# Patient Record
Sex: Female | Born: 1980 | Race: Black or African American | Hispanic: No | Marital: Single | Smoking: Never smoker
Health system: Southern US, Community
[De-identification: ages and names within clinical notes are randomized; demographics above are authoritative.]

## PROBLEM LIST (undated history)

## (undated) DIAGNOSIS — IMO0001 Reserved for inherently not codable concepts without codable children: Secondary | ICD-10-CM

## (undated) DIAGNOSIS — R002 Palpitations: Secondary | ICD-10-CM

## (undated) HISTORY — DX: Reserved for inherently not codable concepts without codable children: IMO0001

## (undated) HISTORY — PX: OTHER SURGICAL HISTORY: SHX169

---

## 2003-06-30 ENCOUNTER — Ambulatory Visit (HOSPITAL_COMMUNITY): Admission: RE | Admit: 2003-06-30 | Discharge: 2003-06-30 | Payer: Self-pay | Admitting: Obstetrics & Gynecology

## 2003-11-19 ENCOUNTER — Inpatient Hospital Stay (HOSPITAL_COMMUNITY): Admission: AD | Admit: 2003-11-19 | Discharge: 2003-11-22 | Payer: Self-pay | Admitting: *Deleted

## 2007-04-05 ENCOUNTER — Emergency Department (HOSPITAL_COMMUNITY): Admission: EM | Admit: 2007-04-05 | Discharge: 2007-04-05 | Payer: Self-pay | Admitting: Emergency Medicine

## 2008-11-04 ENCOUNTER — Emergency Department (HOSPITAL_COMMUNITY): Admission: EM | Admit: 2008-11-04 | Discharge: 2008-11-05 | Payer: Self-pay | Admitting: Emergency Medicine

## 2009-01-26 ENCOUNTER — Emergency Department (HOSPITAL_COMMUNITY): Admission: EM | Admit: 2009-01-26 | Discharge: 2009-01-26 | Payer: Self-pay | Admitting: Emergency Medicine

## 2011-01-17 ENCOUNTER — Inpatient Hospital Stay (INDEPENDENT_AMBULATORY_CARE_PROVIDER_SITE_OTHER)
Admission: RE | Admit: 2011-01-17 | Discharge: 2011-01-17 | Disposition: A | Discharge status: Home / Self Care | Payer: Self-pay | Source: Ambulatory Visit | Attending: Family Medicine | Admitting: Family Medicine

## 2011-01-17 DIAGNOSIS — M79609 Pain in unspecified limb: Secondary | ICD-10-CM

## 2011-02-08 ENCOUNTER — Other Ambulatory Visit: Payer: Self-pay

## 2011-02-08 ENCOUNTER — Emergency Department (HOSPITAL_BASED_OUTPATIENT_CLINIC_OR_DEPARTMENT_OTHER)
Admission: EM | Admit: 2011-02-08 | Discharge: 2011-02-08 | Disposition: A | Discharge status: Home / Self Care | Payer: Self-pay | Attending: Emergency Medicine | Admitting: Emergency Medicine

## 2011-02-08 ENCOUNTER — Encounter: Payer: Self-pay | Admitting: *Deleted

## 2011-02-08 DIAGNOSIS — I499 Cardiac arrhythmia, unspecified: Secondary | ICD-10-CM | POA: Insufficient documentation

## 2011-02-08 DIAGNOSIS — R002 Palpitations: Secondary | ICD-10-CM | POA: Insufficient documentation

## 2011-02-08 LAB — BASIC METABOLIC PANEL
BUN: 11 mg/dL (ref 6–23)
Chloride: 105 mEq/L (ref 96–112)
Glucose, Bld: 86 mg/dL (ref 70–99)
Sodium: 139 mEq/L (ref 135–145)

## 2011-02-08 LAB — DIFFERENTIAL
Basophils Absolute: 0 K/uL (ref 0.0–0.1)
Basophils Relative: 0 % (ref 0–1)
Eosinophils Absolute: 0.2 K/uL (ref 0.0–0.7)
Eosinophils Relative: 2 % (ref 0–5)
Lymphocytes Relative: 35 % (ref 12–46)
Lymphs Abs: 2.9 K/uL (ref 0.7–4.0)
Monocytes Absolute: 0.8 K/uL (ref 0.1–1.0)
Monocytes Relative: 10 % (ref 3–12)
Neutro Abs: 4.4 K/uL (ref 1.7–7.7)
Neutrophils Relative %: 54 % (ref 43–77)

## 2011-02-08 LAB — PREGNANCY, URINE: Preg Test, Ur: NEGATIVE

## 2011-02-08 LAB — URINALYSIS, ROUTINE W REFLEX MICROSCOPIC
Bilirubin Urine: NEGATIVE
Glucose, UA: NEGATIVE mg/dL
Ketones, ur: NEGATIVE mg/dL
Nitrite: NEGATIVE
Protein, ur: 100 mg/dL — AB
Specific Gravity, Urine: 1.006 (ref 1.005–1.030)
Urobilinogen, UA: 1 mg/dL (ref 0.0–1.0)
pH: 7 (ref 5.0–8.0)

## 2011-02-08 LAB — D-DIMER, QUANTITATIVE: D-Dimer, Quant: 0.24 ug{FEU}/mL (ref 0.00–0.48)

## 2011-02-08 LAB — CBC
HCT: 36 % (ref 36.0–46.0)
Hemoglobin: 12.3 g/dL (ref 12.0–15.0)
MCH: 31.2 pg (ref 26.0–34.0)
MCHC: 34.2 g/dL (ref 30.0–36.0)
MCV: 91.4 fL (ref 78.0–100.0)
Platelets: 217 K/uL (ref 150–400)
RBC: 3.94 MIL/uL (ref 3.87–5.11)
RDW: 12.4 % (ref 11.5–15.5)
WBC: 8.2 K/uL (ref 4.0–10.5)

## 2011-02-08 LAB — TROPONIN I: Troponin I: <0.3 ng/mL

## 2011-02-08 NOTE — ED Provider Notes (Signed)
History     CSN: [id number] Arrival date & time: 02/08/2011 12:58 AM   First MD Initiated Contact with Patient 02/08/11 0109      Chief Complaint  Patient presents with  . Irregular Heart Beat    Pt. reports she got hot in bed and then felt her heart race. Pt. reports she felt the need to poop but didn't.      (Consider location/radiation/quality/duration/timing/severity/associated sxs/prior treatment) Patient is a 30 y.o. female presenting with palpitations. The history is provided by the patient.  Palpitations  This is a new problem. The problem occurs constantly. The problem has been resolved. Associated with: Patient is going to sleep and she received an e-mail on her phone that startled her. She developed palpitations that persisted until she got to the emergency department. On average, each episode lasts 60 minutes. Associated symptoms include irregular heartbeat. Pertinent negatives include no diaphoresis, no fever, no chest pain, no near-syncope, no orthopnea, no abdominal pain, no nausea, no vomiting, no headaches, no back pain, no leg pain, no lower extremity edema, no dizziness and no shortness of breath. She has tried nothing for the symptoms. Risk factors include no known risk factors. Her past medical history does not include anemia, heart disease or hyperthyroidism.   Symptoms moderate. There is associated anxiety. No fevers or recent illness. No history of blood clots. Patient has never had her thyroid checked. Currently on menses, on time and normal, denies heavy bleeding. She denies any drug use, alcohol, caffeine, medications or change in diet. She denies any significant stressors. She did develop some numbness in her hands that resolved. Some anxiety now otherwise without any palpitations or other symptoms.  History reviewed. No pertinent past medical history.  History reviewed. No pertinent past surgical history.  No family history on file.  History  Substance Use  Topics  . Smoking status: Not on file  . Smokeless tobacco: Not on file  . Alcohol Use: Not on file    OB History    Grav Para Term Preterm Abortions TAB SAB Ect Mult Living                  Review of Systems  Constitutional: Negative for fever, chills and diaphoresis.  HENT: Negative for neck pain, neck stiffness and tinnitus.   Eyes: Negative for pain.  Respiratory: Negative for shortness of breath.   Cardiovascular: Positive for palpitations. Negative for chest pain, orthopnea and near-syncope.  Gastrointestinal: Negative for nausea, vomiting and abdominal pain.  Genitourinary: Negative for dysuria.  Musculoskeletal: Negative for back pain.  Skin: Negative for rash.  Neurological: Negative for dizziness and headaches.  All other systems reviewed and are negative.    Allergies  Review of patient's allergies indicates not on file.  Home Medications  No current outpatient prescriptions on file.  BP 119/71  Pulse 76  Temp(Src) 97.8 F (36.6 C) (Oral)  Resp 16  Ht 5\' 6"  (1.676 m)  Wt 175 lb (79.379 kg)  BMI 28.25 kg/m2  SpO2 100%  LMP 02/08/2011  Physical Exam  Constitutional: She is oriented to person, place, and time. She appears well-developed and well-nourished.  HENT:  Head: Normocephalic and atraumatic.  Eyes: Conjunctivae and EOM are normal. Pupils are equal, round, and reactive to light.  Neck: Trachea normal. Neck supple. No thyromegaly present.  Cardiovascular: Normal rate, regular rhythm, S1 normal, S2 normal and normal pulses.     No systolic murmur is present   No diastolic murmur is present  Pulses:      Radial pulses are 2+ on the right side, and 2+ on the left side.  Pulmonary/Chest: Effort normal and breath sounds normal. She has no wheezes. She has no rhonchi. She has no rales. She exhibits no tenderness.  Abdominal: Soft. Normal appearance and bowel sounds are normal. There is no tenderness. There is no CVA tenderness and negative Murphy's  sign.  Musculoskeletal:       BLE:s Calves nontender, no cords or erythema, negative Homans sign  Neurological: She is alert and oriented to person, place, and time. She has normal strength. No cranial nerve deficit or sensory deficit. GCS eye subscore is 4. GCS verbal subscore is 5. GCS motor subscore is 6.  Skin: Skin is warm and dry. No rash noted. She is not diaphoretic.  Psychiatric: Her speech is normal.       Cooperative and appropriate    ED Course  Procedures (including critical care time)  Results for orders placed during the hospital encounter of 02/08/11  CBC      Component Value Range   WBC 8.2  4.0 - 10.5 (K/uL)   RBC 3.94  3.87 - 5.11 (MIL/uL)   Hemoglobin 12.3  12.0 - 15.0 (g/dL)   HCT 16.1  09.6 - 04.5 (%)   MCV 91.4  78.0 - 100.0 (fL)   MCH 31.2  26.0 - 34.0 (pg)   MCHC 34.2  30.0 - 36.0 (g/dL)   RDW 40.9  81.1 - 91.4 (%)   Platelets 217  150 - 400 (K/uL)  DIFFERENTIAL      Component Value Range   Neutrophils Relative 54  43 - 77 (%)   Neutro Abs 4.4  1.7 - 7.7 (K/uL)   Lymphocytes Relative 35  12 - 46 (%)   Lymphs Abs 2.9  0.7 - 4.0 (K/uL)   Monocytes Relative 10  3 - 12 (%)   Monocytes Absolute 0.8  0.1 - 1.0 (K/uL)   Eosinophils Relative 2  0 - 5 (%)   Eosinophils Absolute 0.2  0.0 - 0.7 (K/uL)   Basophils Relative 0  0 - 1 (%)   Basophils Absolute 0.0  0.0 - 0.1 (K/uL)  BASIC METABOLIC PANEL      Component Value Range   Sodium 139  135 - 145 (mEq/L)   Potassium 3.4 (*) 3.5 - 5.1 (mEq/L)   Chloride 105  96 - 112 (mEq/L)   CO2 27  19 - 32 (mEq/L)   Glucose, Bld 86  70 - 99 (mg/dL)   BUN 11  6 - 23 (mg/dL)   Creatinine, Ser 7.82  0.50 - 1.10 (mg/dL)   Calcium 9.5  8.4 - 95.6 (mg/dL)   GFR calc non Af Amer >90  >90 (mL/min)   GFR calc Af Amer >90  >90 (mL/min)  URINALYSIS, ROUTINE W REFLEX MICROSCOPIC      Component Value Range   Color, Urine RED (*) YELLOW    Appearance CLEAR  CLEAR    Specific Gravity, Urine 1.006  1.005 - 1.030    pH 7.0  5.0  - 8.0    Glucose, UA NEGATIVE  NEGATIVE (mg/dL)   Hgb urine dipstick LARGE (*) NEGATIVE    Bilirubin Urine NEGATIVE  NEGATIVE    Ketones, ur NEGATIVE  NEGATIVE (mg/dL)   Protein, ur 213 (*) NEGATIVE (mg/dL)   Urobilinogen, UA 1.0  0.0 - 1.0 (mg/dL)   Nitrite NEGATIVE  NEGATIVE    Leukocytes, UA SMALL (*) NEGATIVE  PREGNANCY, URINE      Component Value Range   Preg Test, Ur NEGATIVE    TROPONIN I      Component Value Range   Troponin I <0.30  <0.30 (ng/mL)  D-DIMER, QUANTITATIVE      Component Value Range   D-Dimer, Quant 0.24  0.00 - 0.48 (ug/mL-FEU)  URINE MICROSCOPIC-ADD ON      Component Value Range   Squamous Epithelial / LPF RARE  RARE    WBC, UA 0-2  <3 (WBC/hpf)   RBC / HPF TOO NUMEROUS TO COUNT  <3 (RBC/hpf)   Bacteria, UA RARE  RARE     Date: 02/08/2011  Rate: 70  Rhythm: sinus arrhythmia  QRS Axis: normal  Intervals: normal  ST/T Wave abnormalities: normal  Conduction Disutrbances:none  Narrative Interpretation:   Old EKG Reviewed: none available  Cardiac monitoring. Labs and CXR as above reviewed.    MDM   Palpitations otherwise healthy adult female with no cardiac risk factors and low pretest probability for PE. D-dimer within normal limits. No recent illness and denies any medications or drug ingestion. Anxiety resolved with no history of same. No significant anemia or electrolyte abnormality.  UA reviewed, current menses. No symptoms in the emergency department and no atrial fibrillation or significant arrhythmia otherwise. Plan primary care referral for thyroid testing and cardiology referral for Holter monitor as an outpatient. Patient stable for discharge and comfortable with fall plan.        Sunnie Nielsen, MD 02/08/11 [phone]

## 2011-02-08 NOTE — ED Notes (Signed)
Pt reports having an episode approx 1hour ago of heart palpitations and "feeling hot all over". Pt states it started after she sat up suddenly from a lying position to answer the phone. The episode lasted approx . Resolved prior to arrival to ER. Denies CP/SOB or other associated symptoms. Pt states "i feel completely fine right now".

## 2011-02-08 NOTE — ED Notes (Signed)
Pt. Reports she saw her Dr. Annie Paras. 1 mth ago due to L arm pain.  Was told by the Dr. All normal vitals and she probably had a pulle muscle.  Pt. Reports she took a 325mg  asprin  On way to hospital.

## 2011-02-08 NOTE — ED Notes (Signed)
Dr. Opitz at bedside. 

## 2011-05-02 ENCOUNTER — Other Ambulatory Visit: Payer: Self-pay

## 2011-05-02 ENCOUNTER — Encounter (HOSPITAL_BASED_OUTPATIENT_CLINIC_OR_DEPARTMENT_OTHER): Payer: Self-pay | Admitting: *Deleted

## 2011-05-02 DIAGNOSIS — R002 Palpitations: Secondary | ICD-10-CM | POA: Insufficient documentation

## 2011-05-02 NOTE — ED Notes (Signed)
C/o waking from sleep and felt like her heart was racing. Denies CP, no SOB. Denies any caffeine intake.

## 2011-05-03 ENCOUNTER — Emergency Department (HOSPITAL_BASED_OUTPATIENT_CLINIC_OR_DEPARTMENT_OTHER)
Admission: EM | Admit: 2011-05-03 | Discharge: 2011-05-03 | Discharge status: Against Medical Advice | Payer: Self-pay | Attending: Emergency Medicine | Admitting: Emergency Medicine

## 2011-05-03 DIAGNOSIS — IMO0001 Reserved for inherently not codable concepts without codable children: Secondary | ICD-10-CM

## 2011-05-03 NOTE — ED Notes (Signed)
Attempted to call from lobby to tx room. Pt not found in lobby

## 2011-05-03 NOTE — ED Notes (Signed)
Pt not found in room. Gown on bed. No staff notified prior to pt leaving.

## 2011-05-19 ENCOUNTER — Encounter: Payer: Self-pay | Admitting: Family Medicine

## 2011-05-19 ENCOUNTER — Ambulatory Visit (INDEPENDENT_AMBULATORY_CARE_PROVIDER_SITE_OTHER): Payer: Self-pay | Admitting: Family Medicine

## 2011-05-19 VITALS — BP 118/72 | HR 80 | Temp 98.5°F | Ht 65.5 in | Wt 176.8 lb

## 2011-05-19 DIAGNOSIS — G479 Sleep disorder, unspecified: Secondary | ICD-10-CM

## 2011-05-19 DIAGNOSIS — G478 Other sleep disorders: Secondary | ICD-10-CM

## 2011-05-19 DIAGNOSIS — R002 Palpitations: Secondary | ICD-10-CM

## 2011-05-19 MED ORDER — ASPIRIN 325 MG PO TABS: 325.0000 mg | ORAL_TABLET | Freq: Every day | ORAL | Status: DC

## 2011-05-19 MED ORDER — ALPRAZOLAM 0.25 MG PO TABS: 0.2500 mg | ORAL_TABLET | Freq: Two times a day (BID) | ORAL | Status: AC | PRN

## 2011-05-19 NOTE — Progress Notes (Signed)
  Subjective:    Veronica Stanton is a 31 y.o. female who presents with palpitations. The symptoms are severe, occur during sleep, and last 15 minutes per episode. They tend to occur while sleeping only. Cardiac risk factors include: none. Aggravating factors: stress/anxiety. Relieving factors: spontaneous. Associated symptoms: palpitations, rapid heart beat and shortness of breath. Patient denies: abdominal pain, calf pain, chest pain, cough, dizziness, fatigue, leg swelling, slow heart beat and syncope.  The following portions of the patient's history were reviewed and updated as appropriate: allergies, current medications, past family history, past medical history, past social history, past surgical history and problem list.  Review of Systems Pertinent items are noted in HPI.   Objective:    BP 118/72  Pulse 80  Temp(Src) 98.5 F (36.9 C) (Oral)  Ht 5' 5.5" (1.664 m)  Wt 176 lb 12.8 oz (80.196 kg)  BMI 28.97 kg/m2  SpO2 97%  LMP 04/25/2011 General appearance: alert, cooperative, appears stated age and no distress Lungs: clear to auscultation bilaterally Heart: S1, S2 normal Extremities: extremities normal, atraumatic, no cyanosis or edema  Cardiographics ECG: from ER reviewed   Assessment:    Palpitations --- only occurs at night  Plan:    refer to pulm for sleep eval ER evaluation Neg for cardiac ? Anxiety-- pt given rx xanax to use prn Follow up in 1 months.

## 2011-05-19 NOTE — Patient Instructions (Addendum)
We will refer you to pulmonary for sleep evaluation since this only occurs at night. You have xanax to take if needed for the panic feelings. Return to office in 1 month or sooner if needed.

## 2011-05-20 LAB — BASIC METABOLIC PANEL
BUN: 9 mg/dL (ref 6–23)
Calcium: 8.7 mg/dL (ref 8.4–10.5)
Creatinine, Ser: 0.8 mg/dL (ref 0.4–1.2)
Glucose, Bld: 87 mg/dL (ref 70–99)
Sodium: 135 mEq/L (ref 135–145)

## 2011-05-20 LAB — CBC WITH DIFFERENTIAL/PLATELET
Basophils Relative: 0.2 % (ref 0.0–3.0)
HCT: 37.5 % (ref 36.0–46.0)
Hemoglobin: 12.6 g/dL (ref 12.0–15.0)
Lymphocytes Relative: 26.3 % (ref 12.0–46.0)
MCHC: 33.6 g/dL (ref 30.0–36.0)
Monocytes Absolute: 0.4 10*3/uL (ref 0.1–1.0)
Neutrophils Relative %: 66.9 % (ref 43.0–77.0)
Platelets: 200 10*3/uL (ref 150.0–400.0)
RBC: 3.97 Mil/uL (ref 3.87–5.11)
RDW: 13.6 % (ref 11.5–14.6)
WBC: 7.2 10*3/uL (ref 4.5–10.5)

## 2011-05-20 LAB — TSH: TSH: 0.68 u[IU]/mL (ref 0.35–5.50)

## 2011-05-31 ENCOUNTER — Ambulatory Visit: Payer: Self-pay | Admitting: Family Medicine

## 2011-06-17 ENCOUNTER — Other Ambulatory Visit: Payer: Self-pay

## 2011-06-17 ENCOUNTER — Emergency Department (HOSPITAL_BASED_OUTPATIENT_CLINIC_OR_DEPARTMENT_OTHER)
Admission: EM | Admit: 2011-06-17 | Discharge: 2011-06-17 | Disposition: A | Discharge status: Home / Self Care | Payer: BC Managed Care – PPO | Attending: Emergency Medicine | Admitting: Emergency Medicine

## 2011-06-17 ENCOUNTER — Emergency Department (INDEPENDENT_AMBULATORY_CARE_PROVIDER_SITE_OTHER): Payer: BC Managed Care – PPO

## 2011-06-17 ENCOUNTER — Encounter (HOSPITAL_BASED_OUTPATIENT_CLINIC_OR_DEPARTMENT_OTHER): Payer: Self-pay | Admitting: *Deleted

## 2011-06-17 DIAGNOSIS — R002 Palpitations: Secondary | ICD-10-CM

## 2011-06-17 DIAGNOSIS — R000 Tachycardia, unspecified: Secondary | ICD-10-CM

## 2011-06-17 LAB — CBC
Hemoglobin: 12.5 g/dL (ref 12.0–15.0)
MCV: 88.3 fL (ref 78.0–100.0)
RBC: 4.02 MIL/uL (ref 3.87–5.11)
RDW: 11.9 % (ref 11.5–15.5)
WBC: 8.5 10*3/uL (ref 4.0–10.5)

## 2011-06-17 LAB — DIFFERENTIAL
Basophils Absolute: 0 10*3/uL (ref 0.0–0.1)
Eosinophils Relative: 3 % (ref 0–5)
Lymphocytes Relative: 34 % (ref 12–46)
Monocytes Relative: 10 % (ref 3–12)
Neutro Abs: 4.5 10*3/uL (ref 1.7–7.7)

## 2011-06-17 LAB — BASIC METABOLIC PANEL
CO2: 24 mEq/L (ref 19–32)
Chloride: 103 mEq/L (ref 96–112)
GFR calc non Af Amer: >90 mL/min (ref >90)
Potassium: 3.6 mEq/L (ref 3.5–5.1)
Sodium: 137 mEq/L (ref 135–145)

## 2011-06-17 LAB — PREGNANCY, URINE: Preg Test, Ur: NEGATIVE

## 2011-06-17 NOTE — Discharge Instructions (Signed)
Palpitations  A palpitation is the feeling that your heartbeat is irregular or is faster than normal. Although this is frightening, it usually is not serious. Palpitations may be caused by excesses of smoking, caffeine, or alcohol. They are also brought on by stress and anxiety. Sometimes, they are caused by heart disease. Unless otherwise noted, your caregiver did not find any signs of serious illness at this time. HOME CARE INSTRUCTIONS  To help prevent palpitations:  Drink decaffeinated coffee, tea, and soda pop. Avoid chocolate.   If you smoke or drink alcohol, quit or cut down as much as possible.   Reduce your stress or anxiety level. Biofeedback, yoga, or meditation will help you relax. Physical activity such as swimming, jogging, or walking also may be helpful.  SEEK MEDICAL CARE IF:   You continue to have a fast heartbeat.   Your palpitations occur more often.  SEEK IMMEDIATE MEDICAL CARE IF: You develop chest pain, shortness of breath, severe headache, dizziness, or fainting. Document Released: 03/11/2000 Document Revised: 03/03/2011 Document Reviewed: 05/11/2007 Exit[employer] Patient Information 2012 [employer]. 

## 2011-06-17 NOTE — ED Notes (Signed)
Pt states that she woke feeling like her heart was racing states that she wants an ECG and VS pt was seen here for same sx in Feb and F/U with PCP all results were -

## 2011-06-17 NOTE — ED Provider Notes (Signed)
History     CSN: [id number]  Arrival date & time 06/17/11  0234   First MD Initiated Contact with Patient 06/17/11 0354      Chief Complaint  Patient presents with  . Tachycardia    (Consider location/radiation/quality/duration/timing/severity/associated sxs/prior treatment) Patient is a 31 y.o. female presenting with palpitations. The history is provided by the patient. No language interpreter was used.  Palpitations  This is a recurrent problem. The current episode started 1 to 2 hours ago. The problem occurs constantly. The problem has been resolved. The problem is associated with stress and anxiety. Pertinent negatives include no diaphoresis, no malaise/fatigue, no numbness, no chest pain, no chest pressure, no claudication, no near-syncope, no orthopnea, no leg pain, no lower extremity edema, no dizziness, no weakness, no cough, no hemoptysis and no shortness of breath.  No OCP, no leg pain or swelling no long car trips or plane trips.  PERC negative.    History reviewed. No pertinent past medical history.  History reviewed. No pertinent past surgical history.  History reviewed. No pertinent family history.  History  Substance Use Topics  . Smoking status: Never Smoker   . Smokeless tobacco: Not on file  . Alcohol Use: No    OB History    Grav Para Term Preterm Abortions TAB SAB Ect Mult Living                  Review of Systems  Constitutional: Negative for malaise/fatigue and diaphoresis.  Eyes: Negative.   Respiratory: Negative for cough, hemoptysis and shortness of breath.   Cardiovascular: Positive for palpitations. Negative for chest pain, orthopnea, claudication, leg swelling and near-syncope.  Gastrointestinal: Negative.   Genitourinary: Negative.   Musculoskeletal: Negative.   Skin: Negative.   Neurological: Negative for dizziness, weakness and numbness.  Hematological: Negative.   Psychiatric/Behavioral: Negative.     Allergies  Review of patient's  allergies indicates no known allergies.  Home Medications   Current Outpatient Rx  Name Route Sig Dispense Refill  . ALPRAZOLAM 0.25 MG PO TABS Oral Take 1 tablet (0.25 mg total) by mouth 2 (two) times daily as needed for sleep. 20 tablet 0  . ASPIRIN 325 MG PO TABS Oral Take 1 tablet (325 mg total) by mouth daily.      BP 114/78  Pulse 80  Temp(Src) 98.1 F (36.7 C) (Oral)  Resp 18  SpO2 100%  LMP 05/27/2011  Physical Exam  Constitutional: She is oriented to person, place, and time. She appears well-developed and well-nourished. No distress.  HENT:  Head: Normocephalic and atraumatic.  Eyes: Conjunctivae are normal.  Neck: Normal range of motion. Neck supple.  Cardiovascular: Normal rate and regular rhythm.   Pulmonary/Chest: Effort normal and breath sounds normal. She has no wheezes. She has no rales.  Abdominal: Soft. Bowel sounds are normal. There is no tenderness.  Musculoskeletal: Normal range of motion.  Neurological: She is alert and oriented to person, place, and time. She has normal reflexes.  Skin: Skin is warm and dry. She is not diaphoretic.  Psychiatric: She has a normal mood and affect.    ED Course  Procedures (including critical care time)  Labs Reviewed  CBC - Abnormal; Notable for the following:    HCT 35.5 (*)    All other components within normal limits  DIFFERENTIAL  BASIC METABOLIC PANEL  TROPONIN I  PREGNANCY, URINE   Dg Chest 2 View  06/17/2011  *RADIOLOGY REPORT*  Clinical Data: Tachycardia.  CHEST - 2  VIEW  Comparison: None.  Findings: The lungs are well-aerated and clear.  There is no evidence of focal opacification, pleural effusion or pneumothorax.  The heart is normal in size; the mediastinal contour is within normal limits.  No acute osseous abnormalities are seen.  IMPRESSION: No acute cardiopulmonary process seen.  Original Report Authenticated By: Tonia Ghent, M.D.     1. Palpitations       MDM   Date: 06/17/2011  Rate:  68  Rhythm: normal sinus rhythm  QRS Axis: normal  Intervals: PR shortened  ST/T Wave abnormalities: normal  Conduction Disutrbances:none  Narrative Interpretation:   Old EKG Reviewed: changes noted   Return for CP, shortness of breath or any concerns.  Follow up with your family doctor for event monitor.  Patient verbalizes understanding and agrees to follow up      Marleta Lapierre Smitty Cords, MD 06/17/11 [phone]

## 2011-06-18 ENCOUNTER — Other Ambulatory Visit (HOSPITAL_BASED_OUTPATIENT_CLINIC_OR_DEPARTMENT_OTHER): Payer: Self-pay

## 2011-06-21 ENCOUNTER — Institutional Professional Consult (permissible substitution): Payer: Self-pay | Admitting: Pulmonary Disease

## 2011-07-28 ENCOUNTER — Emergency Department (HOSPITAL_BASED_OUTPATIENT_CLINIC_OR_DEPARTMENT_OTHER)
Admission: EM | Admit: 2011-07-28 | Discharge: 2011-07-29 | Disposition: A | Discharge status: Home / Self Care | Payer: BC Managed Care – PPO | Attending: Emergency Medicine | Admitting: Emergency Medicine

## 2011-07-28 ENCOUNTER — Encounter (HOSPITAL_BASED_OUTPATIENT_CLINIC_OR_DEPARTMENT_OTHER): Payer: Self-pay | Admitting: Emergency Medicine

## 2011-07-28 DIAGNOSIS — R42 Dizziness and giddiness: Secondary | ICD-10-CM | POA: Insufficient documentation

## 2011-07-28 LAB — URINALYSIS, ROUTINE W REFLEX MICROSCOPIC
Ketones, ur: NEGATIVE mg/dL
Leukocytes, UA: NEGATIVE
Nitrite: NEGATIVE
Protein, ur: NEGATIVE mg/dL
Urobilinogen, UA: 1 mg/dL (ref 0.0–1.0)

## 2011-07-28 NOTE — ED Notes (Addendum)
Pt states "I just want to make sure my heart is ok". Pt has had negative EKG's, pt states she occasionally has racing heart beat. Pt also c/o dry cough.

## 2011-07-28 NOTE — ED Provider Notes (Signed)
History     CSN: [id number]  Arrival date & time 07/28/11  2138   First MD Initiated Contact with Patient 07/28/11 2315      Chief Complaint  Patient presents with  . Dizziness    (Consider location/radiation/quality/duration/timing/severity/associated sxs/prior treatment) HPI This is a 31 year old white female who had an episode of lightheadedness after eating dinner this evening. It lasted about 20 minutes and resolved after taking an aspirin. The symptoms are mild to moderate. She has had similar episodes in the past and was worked up by her primary care physician; she states nothing significant was found. She has had episodes of having her heart racing; these usually occur at night. Her primary care physician and discussed having her wear a Holter monitor but this has not yet been done. She denies chest pain or dyspnea. She states her thyroid function has been tested and found to be normal. She states she has no history of anemia.  History reviewed. No pertinent past medical history.  History reviewed. No pertinent past surgical history.  No family history on file.  History  Substance Use Topics  . Smoking status: Never Smoker   . Smokeless tobacco: Not on file  . Alcohol Use: No    OB History    Grav Para Term Preterm Abortions TAB SAB Ect Mult Living                  Review of Systems  All other systems reviewed and are negative.    Allergies  Review of patient's allergies indicates no known allergies.  Home Medications   Current Outpatient Rx  Name Route Sig Dispense Refill  . ASPIRIN 325 MG PO TABS Oral Take 1 tablet (325 mg total) by mouth daily.      BP 116/77  Pulse 62  Temp(Src) 97.5 F (36.4 C) (Oral)  Resp 16  SpO2 100%  Physical Exam General: Well-developed, well-nourished female in no acute distress; appearance consistent with age of record HENT: normocephalic, atraumatic Eyes: pupils equal round and reactive to light; extraocular muscles  intact Neck: supple Heart: Normal sinus rhythm with sinus arrhythmia Lungs: clear to auscultation bilaterally Abdomen: soft; nondistended; nontender; bowel sounds present Extremities: No deformity; full range of motion; pulses normal Neurologic: Awake, alert and oriented; motor function intact in all extremities and symmetric; no facial droop Skin: Warm and dry Psychiatric: Normal mood and affect    ED Course  Procedures (including critical care time)     MDM   Nursing notes and vitals signs, including pulse oximetry, reviewed.  Summary of this visit's results, reviewed by myself:  Labs:  Results for orders placed during the hospital encounter of 07/28/11  URINALYSIS, ROUTINE W REFLEX MICROSCOPIC      Component Value Range   Color, Urine YELLOW  YELLOW    APPearance CLEAR  CLEAR    Specific Gravity, Urine 1.010  1.005 - 1.030    pH 7.0  5.0 - 8.0    Glucose, UA NEGATIVE  NEGATIVE (mg/dL)   Hgb urine dipstick NEGATIVE  NEGATIVE    Bilirubin Urine NEGATIVE  NEGATIVE    Ketones, ur NEGATIVE  NEGATIVE (mg/dL)   Protein, ur NEGATIVE  NEGATIVE (mg/dL)   Urobilinogen, UA 1.0  0.0 - 1.0 (mg/dL)   Nitrite NEGATIVE  NEGATIVE    Leukocytes, UA NEGATIVE  NEGATIVE   PREGNANCY, URINE      Component Value Range   Preg Test, Ur NEGATIVE  NEGATIVE  Date: 07/28/2011 23:27  Rate: 65  Rhythm: normal sinus rhythm with sinus arrhythmia  QRS Axis: normal  Intervals: normal  ST/T Wave abnormalities: normal  Conduction Disutrbances: none  Narrative Interpretation: unremarkable  Comparison with previous EKG: PR interval has normalized  12:11 AM Patient will follow up with her primary care physician for possible Holter monitoring.           Hanley Seamen, MD 07/29/11 [phone]

## 2011-07-28 NOTE — ED Notes (Signed)
Pt reports light headedness and dizziness after eating. Pt has intermittent sensation of difficulty swallowing when drinking water.

## 2011-07-29 ENCOUNTER — Encounter (HOSPITAL_BASED_OUTPATIENT_CLINIC_OR_DEPARTMENT_OTHER): Payer: Self-pay

## 2011-07-29 NOTE — Discharge Instructions (Signed)

## 2011-10-19 ENCOUNTER — Encounter: Payer: Self-pay | Admitting: Internal Medicine

## 2011-10-19 ENCOUNTER — Ambulatory Visit (INDEPENDENT_AMBULATORY_CARE_PROVIDER_SITE_OTHER): Payer: BC Managed Care – PPO | Admitting: Internal Medicine

## 2011-10-19 VITALS — BP 118/76 | HR 76 | Temp 98.0°F | Wt 173.0 lb

## 2011-10-19 DIAGNOSIS — D649 Anemia, unspecified: Secondary | ICD-10-CM

## 2011-10-19 DIAGNOSIS — I479 Paroxysmal tachycardia, unspecified: Secondary | ICD-10-CM

## 2011-10-19 DIAGNOSIS — R002 Palpitations: Secondary | ICD-10-CM

## 2011-10-19 DIAGNOSIS — R42 Dizziness and giddiness: Secondary | ICD-10-CM

## 2011-10-19 NOTE — Progress Notes (Signed)
  Subjective:    Patient ID: Veronica Stanton, female    DOB: 09/21/1980, 31 y.o.   MRN: [id number]  HPI She has been having intermittent tachycardia since December 2012. Typically this would  last 15 min & occur a at night and awaken her. She states that these episodes have been associated with sensation of seeing" black writing "on the wall which faces the bed.The pattern recurred in January, April, May, and again this month. On 7/21 she noticed the tachycardia  while driving; this was associated with significant dizziness. She had not had dizziness with the prior episodes. She was seen in the emergency room for the 2 episodes in December and January.  EKG 07/28/11 revealed nonspecific T wave changes. Review of labs in the chart reveals hematocrit 35.5 on 06/17/11. Full thyroid function tests were normal on 2/21   Review of Systems These episodes have not been associated with benign positional vertigo,chest  pain, straining or palpitations. She did have a headache after the episode 7/21. She is not had numbness or tingling or weakness in her limbs. She's had no associated seizure activity, syncope, or stool or urine incontinence. She has not had associated blurred vision, double vision, or loss of vision. There's been no tinnitus or hearing loss. She denies dyspnea but has a sensation that she can't get a complete breath. She denies anxiety, depression,or  panic attacks. There has not been a constellation of headache, flushing, chest pain, and diarrhea.        Objective:   Physical Exam  Gen. appearance: Well-nourished, in no distress Eyes: Extraocular motion intact, field of vision normal, vision grossly intact, no nystagmus ENT: Canals clear, tympanic membranes normal, tuning fork exam normal, hearing grossly normal Neck: Normal range of motion, no masses, normal thyroid Cardiovascular: Rate and rhythm normal; no murmurs, gallops or extra heart sounds Muscle skeletal: Range of motion, tone, &   strength normal Neuro:no cranial nerve deficit, deep tendon  reflexes normal, gait normal, Romberg testing and finger to nose testing normal Lymph: No cervical or axillary LA Skin: Warm and dry without suspicious lesions or rashes Psych: no anxiety or mood change. Normally interactive and cooperative. Very articulate and intelligent        Assessment & Plan:  #1 paroxysmal tachycardia x8 months  #2 isolated episode of dizziness  #3 visual dysfunction with # 1  #4 PAC vs PNC on EKG; the nonspecific T changes noted previously have improved. It is necessary to rule out paroxysmal supraventricular tachycardia.  Plan: An event monitor will be necessary. Low-dose coated aspirin recommended. See orders for labs

## 2011-10-19 NOTE — Patient Instructions (Addendum)
To prevent palpitations or premature beats, avoid stimulants such as decongestants, diet pills, nicotine, or caffeine (coffee, tea, cola, or chocolate) to excess.  Please take enteric-coated aspirin 81 mg daily with breakfast. Please try to go on My Chart within the next 24 hours to allow me to release the results directly to you.

## 2011-10-20 LAB — MAGNESIUM: Magnesium: 1.8 mg/dL (ref 1.5–2.5)

## 2011-10-20 LAB — CBC WITH DIFFERENTIAL/PLATELET
Basophils Absolute: 0 10*3/uL (ref 0.0–0.1)
Eosinophils Absolute: 0.1 10*3/uL (ref 0.0–0.7)
Hemoglobin: 12.6 g/dL (ref 12.0–15.0)
Lymphocytes Relative: 30 % (ref 12.0–46.0)
MCHC: 33.4 g/dL (ref 30.0–36.0)
Neutro Abs: 5 10*3/uL (ref 1.4–7.7)
Neutrophils Relative %: 62.2 % (ref 43.0–77.0)
Platelets: 148 10*3/uL — ABNORMAL LOW (ref 150.0–400.0)
RDW: 13.3 % (ref 11.5–14.6)

## 2011-10-20 LAB — TSH: TSH: 0.82 u[IU]/mL (ref 0.35–5.50)

## 2011-10-20 LAB — BASIC METABOLIC PANEL
Chloride: 102 mEq/L (ref 96–112)
Creatinine, Ser: 0.8 mg/dL (ref 0.4–1.2)

## 2011-10-24 ENCOUNTER — Telehealth: Payer: Self-pay | Admitting: Family Medicine

## 2011-10-24 DIAGNOSIS — I479 Paroxysmal tachycardia, unspecified: Secondary | ICD-10-CM

## 2011-10-24 DIAGNOSIS — R002 Palpitations: Secondary | ICD-10-CM

## 2011-10-24 NOTE — Telephone Encounter (Signed)
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 Her history seems straightforward &potentially worrisome. I recommend cardiology consultation. I would also like to refer this chart to [facility] Peer Review concerning my request for the event monitor

## 2011-10-24 NOTE — Telephone Encounter (Signed)
Pt number on chart is incorrect contacted Pt EC and advise him to have Pt give [place] a call.

## 2011-10-24 NOTE — Telephone Encounter (Signed)
Discuss with patient  

## 2011-10-24 NOTE — Telephone Encounter (Signed)
In reference to your order for a Cardiac Event Monitor, per fax from [facility], they have denied.  Their reason states that the request does not meet the definition of Medical Necessity found in the member's benefit booklet.  Fax placed in your box.

## 2011-10-24 NOTE — Telephone Encounter (Signed)
hopp ordered this

## 2011-10-24 NOTE — Telephone Encounter (Signed)
Pt returned your call. I updated her ph# in EPIC. Pt can be reached at [phone]

## 2011-10-25 NOTE — Telephone Encounter (Signed)
Patient scheduled for consult with Dr. Ladona Ridgel at [facility], and I have made patient aware.

## 2011-10-25 NOTE — Addendum Note (Signed)
Addended by: Candie Echevaria L on: 10/25/2011 08:55 AM   Modules accepted: Orders

## 2011-11-16 ENCOUNTER — Encounter: Payer: Self-pay | Admitting: Internal Medicine

## 2011-11-16 ENCOUNTER — Ambulatory Visit (INDEPENDENT_AMBULATORY_CARE_PROVIDER_SITE_OTHER): Payer: BC Managed Care – PPO | Admitting: Internal Medicine

## 2011-11-16 VITALS — BP 102/74 | HR 66 | Ht 67.0 in | Wt 173.6 lb

## 2011-11-16 DIAGNOSIS — R0600 Dyspnea, unspecified: Secondary | ICD-10-CM | POA: Insufficient documentation

## 2011-11-16 DIAGNOSIS — R0989 Other specified symptoms and signs involving the circulatory and respiratory systems: Secondary | ICD-10-CM

## 2011-11-16 DIAGNOSIS — R002 Palpitations: Secondary | ICD-10-CM

## 2011-11-16 NOTE — Patient Instructions (Addendum)
Your physician recommends that you schedule a follow-up appointment in: 7-8 weeks with Dr Ladona Ridgel  Your physician has requested that you have an echocardiogram. Echocardiography is a painless test that uses sound waves to create images of your heart. It provides your doctor with information about the size and shape of your heart and how well your heart's chambers and valves are working. This procedure takes approximately one hour. There are no restrictions for this procedure.   Your physician has recommended that you wear an event monitor. Event monitors are medical devices that record the heart's electrical activity. Doctors most often [place] these monitors to diagnose arrhythmias. Arrhythmias are problems with the speed or rhythm of the heartbeat. The monitor is a small, portable device. You can wear one while you do your normal daily activities. This is usually used to diagnose what is causing palpitations/syncope (passing out).  Palpitations  A palpitation is the feeling that your heartbeat is irregular or is faster than normal. Although this is frightening, it usually is not serious. Palpitations may be caused by excesses of smoking, caffeine, or alcohol. They are also brought on by stress and anxiety. Sometimes, they are caused by heart disease. Unless otherwise noted, your caregiver did not find any signs of serious illness at this time. HOME CARE INSTRUCTIONS  To help prevent palpitations:  Drink decaffeinated coffee, tea, and soda pop. Avoid chocolate.   If you smoke or drink alcohol, quit or cut down as much as possible.   Reduce your stress or anxiety level. Biofeedback, yoga, or meditation will help you relax. Physical activity such as swimming, jogging, or walking also may be helpful.  SEEK MEDICAL CARE IF:   You continue to have a fast heartbeat.   Your palpitations occur more often.  SEEK IMMEDIATE MEDICAL CARE IF: You develop chest pain, shortness of breath, severe headache,  dizziness, or fainting. Document Released: 03/11/2000 Document Revised: 03/03/2011 Document Reviewed: 05/11/2007 [facility] Patient Information 2012 [place], [place].

## 2011-11-16 NOTE — Progress Notes (Signed)
HPI Veronica Stanton is referred today for evaluation of tachycardia palpitations. The patient is a very pleasant 31 year old woman who's health is been quite good. Over the last 7 or 8 months, she has had episodes where her heart will suddenly feel like it is racing. These episodes typically last 15-20 minutes, starting and stopping suddenly. She has not been able to count her heart rate but she notes it is quite fast and regular. She has very mild shortness of breath with these episodes but has had no syncope or chest pressure. On several occasions, she has gone to the emergency room but prior to her arrival, the episode stop suddenly. She denies peripheral edema or other cardiac symptoms. No Known Allergies   Current Outpatient Prescriptions  Medication Sig Dispense Refill  . aspirin 81 MG tablet Take 81 mg by mouth every 7 (seven) days.         Past Medical History  Diagnosis Date  . No diagnosis     ROS:   All systems reviewed and negative except as noted in the HPI.   Past Surgical History  Procedure Date  . Negative      Family History  Problem Relation Age of Onset  . Stroke Neg Hx   . Heart disease Neg Hx   . Mental illness Neg Hx      History   Social History  . Marital Status: Single    Spouse Name: N/A    Number of Children: N/A  . Years of Education: N/A   Occupational History  . Not on file.   Social History Main Topics  . Smoking status: Never Smoker   . Smokeless tobacco: Not on file  . Alcohol Use: No  . Drug Use: No  . Sexually Active:    Other Topics Concern  . Not on file   Social History Narrative  . No narrative on file     BP 102/74  Pulse 66  Ht 5\' 7"  (1.702 m)  Wt 173 lb 9.6 oz (78.744 kg)  BMI 27.19 kg/m2  Physical Exam:  Well appearing 31 year old woman, NAD HEENT: Unremarkable Neck:  No JVD, no thyromegally Lungs:  Clear with no wheezes, rales, or rhonchi. HEART:  Regular rate rhythm, no murmurs, no rubs, no clicks Abd:   soft, positive bowel sounds, no organomegally, no rebound, no guarding Ext:  2 plus pulses, no edema, no cyanosis, no clubbing Skin:  No rashes no nodules Neuro:  CN II through XII intact, motor grossly intact  EKG Normal sinus rhythm with a short PR interval but no ventricular preexcitation.  Assess/Plan:

## 2011-11-16 NOTE — Assessment & Plan Note (Signed)
Her dyspnea is typically do to her tachycardia palpitations I've recommended a 2-D echo to rule out evidence of structural heart disease as the finding of left ventricular dysfunction and palpitations has a very different prognostic meaning compared to palpitations with preserved left ventricular function.

## 2011-11-22 ENCOUNTER — Ambulatory Visit (HOSPITAL_COMMUNITY): Payer: BC Managed Care – PPO | Attending: Cardiology | Admitting: Radiology

## 2011-11-22 ENCOUNTER — Encounter (INDEPENDENT_AMBULATORY_CARE_PROVIDER_SITE_OTHER): Payer: BC Managed Care – PPO

## 2011-11-22 DIAGNOSIS — I059 Rheumatic mitral valve disease, unspecified: Secondary | ICD-10-CM | POA: Insufficient documentation

## 2011-11-22 DIAGNOSIS — I079 Rheumatic tricuspid valve disease, unspecified: Secondary | ICD-10-CM | POA: Insufficient documentation

## 2011-11-22 DIAGNOSIS — R002 Palpitations: Secondary | ICD-10-CM

## 2011-11-22 DIAGNOSIS — R0989 Other specified symptoms and signs involving the circulatory and respiratory systems: Secondary | ICD-10-CM | POA: Insufficient documentation

## 2011-11-22 DIAGNOSIS — R0609 Other forms of dyspnea: Secondary | ICD-10-CM | POA: Insufficient documentation

## 2011-11-22 NOTE — Progress Notes (Signed)
Echocardiogram performed.  

## 2012-01-12 ENCOUNTER — Ambulatory Visit (INDEPENDENT_AMBULATORY_CARE_PROVIDER_SITE_OTHER): Payer: BC Managed Care – PPO | Admitting: Internal Medicine

## 2012-01-12 ENCOUNTER — Encounter: Payer: Self-pay | Admitting: Internal Medicine

## 2012-01-12 VITALS — BP 124/82 | HR 67 | Ht 67.0 in | Wt 173.0 lb

## 2012-01-12 DIAGNOSIS — R0989 Other specified symptoms and signs involving the circulatory and respiratory systems: Secondary | ICD-10-CM

## 2012-01-12 DIAGNOSIS — R002 Palpitations: Secondary | ICD-10-CM

## 2012-01-12 DIAGNOSIS — R0600 Dyspnea, unspecified: Secondary | ICD-10-CM

## 2012-01-12 NOTE — Progress Notes (Signed)
HPI Mrs. Veronica Stanton returns today for followup. She is a very pleasant 31 year old woman who I saw several weeks ago who carries a history of tachycardia palpitations, and increasing dyspnea of unclear etiology. Subsequent evaluation demonstrates normal left ventricular function by echo and no arrhythmias seen by 48 hour Holter monitor. Her symptoms have improved in the interim. She admits to being fairly sedentary walking only one day a week. She has had no sustained arrhythmias. She denies peripheral edema, chest pain, or syncope. No Known Allergies   Current Outpatient Prescriptions  Medication Sig Dispense Refill  . aspirin 81 MG tablet Take 81 mg by mouth every 7 (seven) days.         Past Medical History  Diagnosis Date  . No diagnosis     ROS:   All systems reviewed and negative except as noted in the HPI.   Past Surgical History  Procedure Date  . Negative      Family History  Problem Relation Age of Onset  . Stroke Neg Hx   . Heart disease Neg Hx   . Mental illness Neg Hx      History   Social History  . Marital Status: Single    Spouse Name: N/A    Number of Children: N/A  . Years of Education: N/A   Occupational History  . Not on file.   Social History Main Topics  . Smoking status: Never Smoker   . Smokeless tobacco: Not on file  . Alcohol Use: No  . Drug Use: No  . Sexually Active:    Other Topics Concern  . Not on file   Social History Narrative  . No narrative on file     BP 124/82  Pulse 67  Ht 5\' 7"  (1.702 m)  Wt 173 lb (78.472 kg)  BMI 27.10 kg/m2  SpO2 99%  Physical Exam:  Well appearing 31 year old woman, NAD HEENT: Unremarkable Neck:  No JVD, no thyromegally Lungs:  Clear with no wheezes, rales, or rhonchi. HEART:  Regular rate rhythm, no murmurs, no rubs, no clicks Abd:  soft, positive bowel sounds, no organomegally, no rebound, no guarding Ext:  2 plus pulses, no edema, no cyanosis, no clubbing Skin:  No rashes no  nodules Neuro:  CN II through XII intact, motor grossly intact  2-D echo - normal left ventricular function, no significant valvular abnormalities.  48 hour Holter monitor  - no sustained arrhythmias. Sinus tachycardia. Noise artifact.  Assess/Plan:

## 2012-01-12 NOTE — Patient Instructions (Signed)
Your physician recommends that you schedule a follow-up appointment as needed  

## 2012-01-12 NOTE — Assessment & Plan Note (Signed)
Her dyspnea is improved. I suspect deconditioning. Her left ventricular function is normal. I've encouraged the patient to start exercising daily. If her symptoms of dyspnea worsen, a cardiopulmonary stress test would be a consideration.

## 2012-01-12 NOTE — Assessment & Plan Note (Signed)
Her symptoms have improved. The etiology is at this time unclear as she had no arrhythmia during the wearing of her cardiac monitor. I've recommended watchful waiting.

## 2012-06-06 ENCOUNTER — Encounter (HOSPITAL_BASED_OUTPATIENT_CLINIC_OR_DEPARTMENT_OTHER): Payer: Self-pay | Admitting: *Deleted

## 2012-06-06 ENCOUNTER — Emergency Department (HOSPITAL_BASED_OUTPATIENT_CLINIC_OR_DEPARTMENT_OTHER)
Admission: EM | Admit: 2012-06-06 | Discharge: 2012-06-06 | Disposition: A | Discharge status: Home / Self Care | Payer: BC Managed Care – PPO | Attending: Emergency Medicine | Admitting: Emergency Medicine

## 2012-06-06 DIAGNOSIS — Z7982 Long term (current) use of aspirin: Secondary | ICD-10-CM | POA: Insufficient documentation

## 2012-06-06 DIAGNOSIS — Y9389 Activity, other specified: Secondary | ICD-10-CM | POA: Insufficient documentation

## 2012-06-06 DIAGNOSIS — Y92410 Unspecified street and highway as the place of occurrence of the external cause: Secondary | ICD-10-CM | POA: Insufficient documentation

## 2012-06-06 DIAGNOSIS — S46812A Strain of other muscles, fascia and tendons at shoulder and upper arm level, left arm, initial encounter: Secondary | ICD-10-CM

## 2012-06-06 DIAGNOSIS — S46819A Strain of other muscles, fascia and tendons at shoulder and upper arm level, unspecified arm, initial encounter: Secondary | ICD-10-CM | POA: Insufficient documentation

## 2012-06-06 DIAGNOSIS — Z8679 Personal history of other diseases of the circulatory system: Secondary | ICD-10-CM | POA: Insufficient documentation

## 2012-06-06 DIAGNOSIS — S43499A Other sprain of unspecified shoulder joint, initial encounter: Secondary | ICD-10-CM | POA: Insufficient documentation

## 2012-06-06 HISTORY — DX: Palpitations: R00.2

## 2012-06-06 MED ORDER — TRAMADOL HCL 50 MG PO TABS
50.0000 mg | ORAL_TABLET | Freq: Four times a day (QID) | ORAL | Status: DC | PRN
Start: 2012-06-06 — End: 2014-07-19

## 2012-06-06 MED ORDER — IBUPROFEN 600 MG PO TABS: 600.0000 mg | ORAL_TABLET | Freq: Four times a day (QID) | ORAL | Status: DC | PRN

## 2012-06-06 MED ORDER — METHOCARBAMOL 500 MG PO TABS: 500.0000 mg | ORAL_TABLET | Freq: Two times a day (BID) | ORAL | Status: DC

## 2012-06-06 MED ORDER — IBUPROFEN 200 MG PO TABS
600.0000 mg | ORAL_TABLET | Freq: Once | ORAL | Status: AC
  Administered 2012-06-06: 600 mg via ORAL
  Filled 2012-06-06: qty 1
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
## 2013-01-31 ENCOUNTER — Other Ambulatory Visit: Payer: Self-pay

## 2014-07-19 ENCOUNTER — Emergency Department (HOSPITAL_BASED_OUTPATIENT_CLINIC_OR_DEPARTMENT_OTHER)
Admission: EM | Admit: 2014-07-19 | Discharge: 2014-07-19 | Disposition: A | Discharge status: Home / Self Care | Payer: BLUE CROSS/BLUE SHIELD | Attending: Emergency Medicine | Admitting: Emergency Medicine

## 2014-07-19 ENCOUNTER — Encounter (HOSPITAL_BASED_OUTPATIENT_CLINIC_OR_DEPARTMENT_OTHER): Payer: Self-pay | Admitting: Emergency Medicine

## 2014-07-19 DIAGNOSIS — R55 Syncope and collapse: Secondary | ICD-10-CM | POA: Diagnosis not present

## 2014-07-19 DIAGNOSIS — Z79899 Other long term (current) drug therapy: Secondary | ICD-10-CM | POA: Insufficient documentation

## 2014-07-19 DIAGNOSIS — Z3202 Encounter for pregnancy test, result negative: Secondary | ICD-10-CM | POA: Insufficient documentation

## 2014-07-19 LAB — CBC WITH DIFFERENTIAL/PLATELET
BASOS ABS: 0 10*3/uL (ref 0.0–0.1)
BASOS PCT: 0 % (ref 0–1)
EOS ABS: 0.1 10*3/uL (ref 0.0–0.7)
EOS PCT: 1 % (ref 0–5)
HCT: 34.6 % — ABNORMAL LOW (ref 36.0–46.0)
Hemoglobin: 11.9 g/dL — ABNORMAL LOW (ref 12.0–15.0)
LYMPHS ABS: 1.3 10*3/uL (ref 0.7–4.0)
Lymphocytes Relative: 20 % (ref 12–46)
MCH: 31.3 pg (ref 26.0–34.0)
MCHC: 34.4 g/dL (ref 30.0–36.0)
MCV: 91.1 fL (ref 78.0–100.0)
Monocytes Absolute: 0.7 10*3/uL (ref 0.1–1.0)
Monocytes Relative: 11 % (ref 3–12)
NEUTROS PCT: 68 % (ref 43–77)
Neutro Abs: 4.3 10*3/uL (ref 1.7–7.7)
PLATELETS: 194 10*3/uL (ref 150–400)
RBC: 3.8 MIL/uL — AB (ref 3.87–5.11)
RDW: 12.2 % (ref 11.5–15.5)
WBC: 6.4 10*3/uL (ref 4.0–10.5)

## 2014-07-19 LAB — BASIC METABOLIC PANEL
Anion gap: 5 (ref 5–15)
BUN: 10 mg/dL (ref 6–23)
CHLORIDE: 105 mmol/L (ref 96–112)
CO2: 24 mmol/L (ref 19–32)
CREATININE: 0.82 mg/dL (ref 0.50–1.10)
Calcium: 8.4 mg/dL (ref 8.4–10.5)
GFR calc non Af Amer: >90 mL/min (ref >90)
Glucose, Bld: 101 mg/dL — ABNORMAL HIGH (ref 70–99)
Potassium: 3.5 mmol/L (ref 3.5–5.1)
Sodium: 134 mmol/L — ABNORMAL LOW (ref 135–145)

## 2014-07-19 LAB — URINALYSIS, ROUTINE W REFLEX MICROSCOPIC
Bilirubin Urine: NEGATIVE
Glucose, UA: NEGATIVE mg/dL
Hgb urine dipstick: NEGATIVE
KETONES UR: 15 mg/dL — AB
LEUKOCYTES UA: NEGATIVE
NITRITE: NEGATIVE
PH: 6 (ref 5.0–8.0)
PROTEIN: NEGATIVE mg/dL
SPECIFIC GRAVITY, URINE: 1.006 (ref 1.005–1.030)
Urobilinogen, UA: 1 mg/dL (ref 0.0–1.0)

## 2014-07-19 LAB — PREGNANCY, URINE: PREG TEST UR: NEGATIVE

## 2014-07-19 LAB — D-DIMER, QUANTITATIVE (NOT AT ARMC): D DIMER QUANT: 0.39 ug{FEU}/mL (ref 0.00–0.48)

## 2014-07-19 MED ORDER — SODIUM CHLORIDE 0.9 % IV SOLN
Freq: Once | INTRAVENOUS | Status: AC
  Administered 2014-07-19: 2000 mL via INTRAVENOUS

## 2014-07-19 NOTE — ED Provider Notes (Signed)
CSN: [id number]     Arrival date & time 07/19/14  0604 History   First MD Initiated Contact with Patient 07/19/14 [phone]     Chief Complaint  Patient presents with  . Loss of Consciousness     HPI Patient presents after a syncopal episode last night when she went to go the bathroom.  911 was called in she has had IV fluids.  She states she feels back to her baseline.  She denies any chest pain or headache.  Has had episode of rapid heartbeat and past. Past Medical History  Diagnosis Date  . No diagnosis   . Palpitations    Past Surgical History  Procedure Laterality Date  . Negative     Family History  Problem Relation Age of Onset  . Stroke Neg Hx   . Heart disease Neg Hx   . Mental illness Neg Hx    History  Substance Use Topics  . Smoking status: Never Smoker   . Smokeless tobacco: Not on file  . Alcohol Use: No   OB History    No data available     Review of Systems  All other systems reviewed and are negative  Allergies  Review of patient's allergies indicates no known allergies.  Home Medications   Prior to Admission medications   Medication Sig Start Date End Date Taking? Authorizing Provider  phentermine 15 MG capsule Take 15 mg by mouth every morning.   Yes Historical Provider, MD   BP 114/83 mmHg  Pulse 113  Temp(Src) 98.2 F (36.8 C) (Oral)  Resp 18  Ht 5\' 7"  (1.702 m)  Wt 183 lb (83.008 kg)  BMI 28.66 kg/m2  SpO2 100%  LMP 07/12/2014 Physical Exam Physical Exam  Nursing note and vitals reviewed. Constitutional: She is oriented to person, place, and time. She appears well-developed and well-nourished. No distress.  HENT:  Head: Normocephalic and atraumatic.  Eyes: Pupils are equal, round, and reactive to light.  Neck: Normal range of motion.  Cardiovascular: Sinus tachycardia and intact distal pulses.   Pulmonary/Chest: No respiratory distress.  Abdominal: Normal appearance. She exhibits no distension.  Musculoskeletal: Normal range of  motion.  Neurological: She is alert and oriented to person, place, and time. No cranial nerve deficit.  Skin: Skin is warm and dry. No rash noted.  Psychiatric: She has a normal mood and affect. Her behavior is normal.   ED Course  Procedures (including critical care time) Labs Review Labs Reviewed  BASIC METABOLIC PANEL - Abnormal; Notable for the following:    Sodium 134 (*)    Glucose, Bld 101 (*)    All other components within normal limits  CBC WITH DIFFERENTIAL/PLATELET - Abnormal; Notable for the following:    RBC 3.80 (*)    Hemoglobin 11.9 (*)    HCT 34.6 (*)    All other components within normal limits  URINALYSIS, ROUTINE W REFLEX MICROSCOPIC - Abnormal; Notable for the following:    Ketones, ur 15 (*)    All other components within normal limits  PREGNANCY, URINE  D-DIMER, QUANTITATIVE    Imaging Review No results found.   EKG Interpretation   Date/Time:  Saturday July 19 2014 06:22:15 EDT Ventricular Rate:  115 PR Interval:  138 QRS Duration: 72 QT Interval:  332 QTC Calculation: 459 R Axis:   26 Text Interpretation:  Sinus tachycardia Nonspecific T wave abnormality  Confirmed by [facility]AS[facility]  MD, APRIL ([id number]) on 07/19/2014 6:37:00 AM     After treatment  in the ED the patient feels back to baseline and wants to go home. MDM   Final diagnoses:  Near syncope        Nelva Nay, MD 07/19/14 [phone]

## 2014-07-19 NOTE — ED Notes (Signed)
Pt states that she has been perfectly healthy, got up to use bathroom, mother states she heard a thud, pt was found down in bathroom, confused, and incontinent of urine

## 2014-07-19 NOTE — Discharge Instructions (Signed)
Near-Syncope Near-syncope (commonly known as near fainting) is sudden weakness, dizziness, or feeling like you might pass out. During an episode of near-syncope, you may also develop pale skin, have tunnel vision, or feel sick to your stomach (nauseous). Near-syncope may occur when getting up after sitting or while standing for a long time. It is caused by a sudden decrease in blood flow to the brain. This decrease can result from various causes or triggers, most of which are not serious. However, because near-syncope can sometimes be a sign of something serious, a medical evaluation is required. The specific cause is often not determined. HOME CARE INSTRUCTIONS  Monitor your condition for any changes. The following actions may help to alleviate any discomfort you are experiencing:  Have someone stay with you until you feel stable.  Lie down right away and prop your feet up if you start feeling like you might faint. Breathe deeply and steadily. Wait until all the symptoms have passed. Most of these episodes last only a few minutes. You may feel tired for several hours.   Drink enough fluids to keep your urine clear or pale yellow.   If you are taking blood pressure or heart medicine, get up slowly when seated or lying down. Take several minutes to sit and then stand. This can reduce dizziness.  Follow up with your health care provider as directed. SEEK IMMEDIATE MEDICAL CARE IF:   You have a severe headache.   You have unusual pain in the chest, abdomen, or back.   You are bleeding from the mouth or rectum, or you have black or tarry stool.   You have an irregular or very fast heartbeat.   You have repeated fainting or have seizure-like jerking during an episode.   You faint when sitting or lying down.   You have confusion.   You have difficulty walking.   You have severe weakness.   You have vision problems.  MAKE SURE YOU:   Understand these instructions.  Will  watch your condition.  Will get help right away if you are not doing well or get worse. Document Released: 03/14/2005 Document Revised: 03/19/2013 Document Reviewed: 08/17/2012 [employer] Patient Information 2015 [employer]. This information is not intended to replace advice given to you by your health care provider. Make sure you discuss any questions you have with your health care provider.  

## 2014-07-28 ENCOUNTER — Encounter: Payer: Self-pay | Admitting: Medical

## 2014-07-28 ENCOUNTER — Ambulatory Visit (INDEPENDENT_AMBULATORY_CARE_PROVIDER_SITE_OTHER): Payer: BLUE CROSS/BLUE SHIELD | Admitting: Medical

## 2014-07-28 ENCOUNTER — Telehealth: Payer: Self-pay | Admitting: Family Medicine

## 2014-07-28 VITALS — BP 131/88 | HR 79 | Temp 98.3°F | Ht 67.0 in | Wt 182.0 lb

## 2014-07-28 DIAGNOSIS — R55 Syncope and collapse: Secondary | ICD-10-CM | POA: Diagnosis not present

## 2014-07-28 NOTE — Patient Instructions (Signed)
Syncope Syncope vs sleep walking. Possible seizure as well. Pt did urinate at the time she fell. Event witnessed by daughter but no reported convulsion. I will go ahead and refer to neurologist. During the interim recommend stopping phentermine.  Since hx of negative holter and ekg negative other day in ED will start with neurology evaluation.   If any recurrent episodes like you described then ED evaluation.    Follow up in one month or as needed.

## 2014-07-28 NOTE — Progress Notes (Signed)
Subjective:    Patient ID: Veronica Stanton, female    DOB: 12/11/1980, 34 y.o.   MRN: [id number]  HPI  Pt in states she that pt was asleep and she got up may have been sleep walking(not on hypnotic type med). And pt just fell out/past ou(This was witnessed by daughter of pt) Pt urinated/incontence of urine at time she fell to ground but no convulsion. Pt came to and ems was present. [facility] Pt had no ha. Pt does take phentermine now. She has been on that since April. Pt on phentermine since 07-15-2014. Pt does remember waking up briefly last night and saw some blurry appearane of writing on the wall. But she speculates this may have been earlier in evening.    Pt does not remember any purposeful thought of trying to go to restroom.   Pt denies any chest pain or palpations recently.   But does 2014 she had brief holter monitor to evaluate subjective tachcardia at night but no tachycardia was found.  LMP- July 12, 2014. Pregnancy was negative.   Ekg done was neg. Cbc baicaly normal(mild anemic), cmp normal except mild low na, d-dimer was negative.  Since then sleeping well. No recurrent episodes.   No hx of seizure.   Review of Systems  Constitutional: Negative for fever, chills, diaphoresis, activity change and fatigue.  Respiratory: Negative for cough, chest tightness and shortness of breath.   Cardiovascular: Negative for chest pain, palpitations and leg swelling.  Gastrointestinal: Negative for nausea, vomiting and abdominal pain.  Musculoskeletal: Negative for neck pain and neck stiffness.  Neurological: Positive for syncope. Negative for dizziness, tremors, seizures, facial asymmetry, speech difficulty, weakness, light-headedness, numbness and headaches.       Syncope vs sleep walking. The way she describes event and incontinence. Question for seizure?  Psychiatric/Behavioral: Negative for behavioral problems, confusion and agitation. The patient is  not nervous/anxious.      Past Medical History  Diagnosis Date  . No diagnosis   . Palpitations     History   Social History  . Marital Status: Single    Spouse Name: N/A  . Number of Children: N/A  . Years of Education: N/A   Occupational History  . Not on file.   Social History Main Topics  . Smoking status: Never Smoker   . Smokeless tobacco: Not on file  . Alcohol Use: No  . Drug Use: No  . Sexual Activity: Not on file   Other Topics Concern  . Not on file   Social History Narrative    Past Surgical History  Procedure Laterality Date  . Negative      Family History  Problem Relation Age of Onset  . Stroke Neg Hx   . Heart disease Neg Hx   . Mental illness Neg Hx     No Known Allergies  Current Outpatient Prescriptions on File Prior to Visit  Medication Sig Dispense Refill  . phentermine 15 MG capsule Take 15 mg by mouth every morning.     No current facility-administered medications on file prior to visit.    BP 131/88 mmHg  Pulse 79  Temp(Src) 98.3 F (36.8 C) (Oral)  Ht  (1.702 m)  Wt 182 lb (82.555 kg)  BMI 28.50 kg/m2  SpO2 98%  LMP 07/07/2014      Objective:   Physical Exam  General Mental Status- Alert. General Appearance- Not in acute distress.   Skin General:  Color- Normal Color. Moisture- Normal Moisture.  Neck Carotid Arteries- Normal color. Moisture- Normal Moisture. No carotid bruits. No JVD.  Chest and Lung Exam Auscultation: Breath Sounds:-Normal.  Cardiovascular Auscultation:Rythm- Regular. Murmurs & Other Heart Sounds:Auscultation of the heart reveals- No Murmurs.  Abdomen Inspection:-Inspeection Normal. Palpation/Percussion:Note:No mass. Palpation and Percussion of the abdomen reveal- Non Tender, Non Distended + BS, no rebound or guarding.    Neurologic Cranial Nerve exam:- CN III-XII intact(No nystagmus), symmetric smile. Drift Test:- No drift. Romberg Exam:- Negative.  Heal to Toe Gait  exam:-Normal. Finger to Nose:- Normal/Intact Strength:- 5/5 equal and symmetric strength both upper and lower extremities.      Assessment & Plan:

## 2014-07-28 NOTE — Progress Notes (Signed)
Pre visit review using our clinic review tool, if applicable. No additional management support is needed unless otherwise documented below in the visit note. 

## 2014-07-28 NOTE — Assessment & Plan Note (Signed)
Syncope vs sleep walking. Possible seizure as well. Pt did urinate at the time she fell. Event witnessed by daughter but no reported convulsion. I will go ahead and refer to neurologist. During the interim recommend stopping phentermine.  Since hx of negative holter and ekg negative other day in ED will start with neurology evaluation.   If any recurrent episodes like you described then ED evaluation.

## 2014-07-28 NOTE — Telephone Encounter (Signed)
PLEASE NOTE: All timestamps contained within this report are represented as [place] Standard Time. CONFIDENTIALTY NOTICE: This fax transmission is intended only for the addressee. It contains information that is legally privileged, confidential or otherwise protected from use or disclosure. If you are not the intended recipient, you are strictly prohibited from reviewing, disclosing, copying using or disseminating any of this information or taking any action in reliance on or regarding this information. If you have received this fax in error, please notify us immediately by telephone so that we can arrange for its return to us. Phone: [phone], Toll-Free: [phone], Fax: [phone] Page: 1 of 1 Call Id: [id number] [facility] - Client TELEPHONE ADVICE RECORD [facility] Patient Name: Duke SalviaFFANY Fine DOB: [birth year] Initial Comment caller states she fainted and was seen in the ER last week, she didn't see paperwork to have a f/u appt, they don't have any available appts today, she is having no sx today Nurse Assessment Nurse: Elijah Birkaldwell, RN, Stark BrayLynda Date/Time (Eastern Time): 07/28/2014 11:17:06 AM Confirm and document reason for call. If symptomatic, describe symptoms. ---Caller states she fainted and was seen in the ER on 4/23, she didn't see paperwork to have a f/u appt, they don't have any available appts. today. She is having no symptoms today. Dx. with near syncope, did blood work & EKG, etc. Initially was told she was making noises in her sleep, when she got up she fainted & urinated on herself. No seizure activity. Had taken Phentermine that week. Has the patient traveled out of the country within the last 30 days? ---Not Applicable Does the patient require triage? ---Yes Related visit to physician within the last 2 weeks? ---Yes Does the PT have any chronic conditions? (i.e. diabetes, asthma, etc.) ---No Did the patient indicate they  were pregnant? ---No Guidelines Guideline Title Affirmed Question Affirmed Notes Fainting [1] All other patients AND [2] now alert and feels fine (Exception: SIMPLE FAINT due to stress, pain, prolonged standing, or suddenly standing) Final Disposition User See Physician within 4 Hours (or PCP triage) Elijah Birkaldwell, RN, Stark BrayLynda

## 2014-07-28 NOTE — Telephone Encounter (Signed)
Patient scheduled with Edward Saguier, PA today.  

## 2014-07-30 ENCOUNTER — Encounter: Payer: Self-pay | Admitting: Neurology

## 2014-07-30 ENCOUNTER — Ambulatory Visit (INDEPENDENT_AMBULATORY_CARE_PROVIDER_SITE_OTHER): Payer: BLUE CROSS/BLUE SHIELD | Admitting: Neurology

## 2014-07-30 VITALS — BP 120/84 | HR 80 | Resp 16 | Ht 66.0 in | Wt 181.0 lb

## 2014-07-30 DIAGNOSIS — R404 Transient alteration of awareness: Secondary | ICD-10-CM | POA: Diagnosis not present

## 2014-07-30 DIAGNOSIS — R55 Syncope and collapse: Secondary | ICD-10-CM
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
## 2014-07-30 NOTE — Patient Instructions (Signed)
1. Schedule MRI brain with and without contrast seizure protocol 2. Schedule 1-hour sleep-deprived EEG, then 24-hour EEG 3. As per [place] driving laws, after an episode of loss of consciousness, one should not drive until 6 months event-free 4. Follow-up after tests

## 2014-07-31 ENCOUNTER — Other Ambulatory Visit: Payer: Self-pay | Admitting: *Deleted

## 2014-07-31 DIAGNOSIS — R55 Syncope and collapse: Secondary | ICD-10-CM

## 2014-08-04 ENCOUNTER — Ambulatory Visit (INDEPENDENT_AMBULATORY_CARE_PROVIDER_SITE_OTHER): Payer: BLUE CROSS/BLUE SHIELD | Admitting: Neurology

## 2014-08-04 DIAGNOSIS — R404 Transient alteration of awareness: Secondary | ICD-10-CM | POA: Diagnosis not present

## 2014-08-04 DIAGNOSIS — R55 Syncope and collapse: Secondary | ICD-10-CM

## 2014-08-06 ENCOUNTER — Telehealth: Payer: Self-pay | Admitting: Family Medicine

## 2014-08-06 NOTE — Telephone Encounter (Signed)
Patient was notified of results. She will proceed with 24 hour eeg scheduled for tomorrow.

## 2014-08-06 NOTE — Procedures (Signed)
ELECTROENCEPHALOGRAM REPORT  Date of Study: 08/04/2014  Patient's Name: Veronica Islamiffany T Stanton MRN: [id number] Date of Birth: May 13, 1980  Referring Provider: Dr. Patrcia DollyKaren Perle [place]  Clinical History: This is a 34 year old woman with episodes of tachycardia and visual hallucinations, recent episode of loss of consciousness with incontinence.  Medications: none  Technical Summary: A multichannel digital 1-hour EEG recording measured by the international 10-20 system with electrodes applied with paste and impedances below 5000 ohms performed in our laboratory with EKG monitoring in an awake and asleep patient.  Hyperventilation and photic stimulation were performed.  The digital EEG was referentially recorded, reformatted, and digitally filtered in a variety of bipolar and referential montages for optimal display.    Description: The patient is awake and asleep during the recording.  During maximal wakefulness, there is a symmetric, medium voltage 10 Hz posterior dominant rhythm that attenuates with eye opening.  The record is symmetric.  During drowsiness and sleep, there is an increase in theta slowing of the background.  Vertex waves and symmetric sleep spindles were seen.  Hyperventilation and photic stimulation did not elicit any abnormalities.  There were no epileptiform discharges or electrographic seizures seen.    EKG lead was unremarkable.  Impression: This 1-hour awake and asleep EEG is normal.    Clinical Correlation: A normal EEG does not exclude a clinical diagnosis of epilepsy.  If further clinical questions remain, prolonged EEG may be helpful.  Clinical correlation is advised.   Patrcia DollyKaren Liesel Peckenpaugh, M.D.

## 2014-08-06 NOTE — Telephone Encounter (Signed)
-----   Message from Van ClinesKaren M Aquino, MD sent at 08/06/2014  1:16 PM EDT ----- Please let patient know routine EEG is normal, would proceed with 24-hour EEG as discussed.

## 2014-08-07 ENCOUNTER — Ambulatory Visit (INDEPENDENT_AMBULATORY_CARE_PROVIDER_SITE_OTHER): Payer: BLUE CROSS/BLUE SHIELD | Admitting: Neurology

## 2014-08-07 DIAGNOSIS — R55 Syncope and collapse: Secondary | ICD-10-CM | POA: Diagnosis not present

## 2014-08-12 ENCOUNTER — Telehealth: Payer: Self-pay | Admitting: Neurology

## 2014-08-12 NOTE — Telephone Encounter (Signed)
Returned call. Let patient know eeg hasn't been resulted yet. I will call her once that is done.

## 2014-08-12 NOTE — Telephone Encounter (Signed)
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 I would recommend starting seizure medication, either Keppra or Lamictal. She is scheduled for an MRI brain tomorrow. She would like to hold off on starting on AED until she sees me in the office to go over MRI and EEG again.    Veronica Stanton, pls schedule f/u with me when I come back. Thanks

## 2014-08-12 NOTE — Telephone Encounter (Signed)
Pt wants to know the results on the 24 hour eeg please call [phone]

## 2014-08-12 NOTE — Procedures (Signed)
ELECTROENCEPHALOGRAM REPORT  Dates of Recording: 08/07/2014 to 08/08/2014  Patient's Name: Veronica Stanton MRN: [id number] Date of Birth: 05-05-1980  Referring Provider: Dr. Patrcia DollyKaren Oline Bel[place]   Procedure: 24-hour ambulatory EEG  History: This is a 34 year old woman with an episode of loss of consciousness last 07/19/14. This was preceded by visual hallucinations of seeing black cursive writing on the wall. She reports these visual symptoms started at age 34, waking her up from sleep. She also report recurrent episodes of a "weird scared feeling" once a month.  Medications: none  Technical Summary: This is a 24-hour multichannel digital EEG recording measured by the international 10-20 system with electrodes applied with paste and impedances below 5000 ohms performed as portable with EKG monitoring.  The digital EEG was referentially recorded, reformatted, and digitally filtered in a variety of bipolar and referential montages for optimal display.    DESCRIPTION OF RECORDING: During maximal wakefulness, the background activity consisted of a symmetric 10 Hz posterior dominant rhythm which was reactive to eye opening.  There were no epileptiform discharges or focal slowing seen in wakefulness.  During the recording, the patient progresses through wakefulness, drowsiness, and Stage 2 sleep. There were occasional right anterior temporal sharp waves seen exclusively in sleep.  Events: Patient did not complete diary. There were no symptoms reported.   There were no electrographic seizures seen.  EKG lead was unremarkable.  IMPRESSION: This 24-hour ambulatory EEG study is abnormal due to occasional right anterior temporal sharp waves seen exclusively in sleep.  CLINICAL CORRELATION of the above findings indicates possible epileptogenic potential over the right anterior temporal region. There were no electrographic seizures seen in this study. Typical episodes of visual hallucinations were not  reported.    Patrcia DollyKaren Jonus Coble, M.D.

## 2014-08-13 ENCOUNTER — Ambulatory Visit
Admission: RE | Admit: 2014-08-13 | Discharge: 2014-08-13 | Disposition: A | Discharge status: Home / Self Care | Payer: BLUE CROSS/BLUE SHIELD | Source: Ambulatory Visit | Attending: Neurology | Admitting: Neurology

## 2014-08-13 ENCOUNTER — Other Ambulatory Visit: Payer: Self-pay | Admitting: Neurology

## 2014-08-13 DIAGNOSIS — R404 Transient alteration of awareness: Secondary | ICD-10-CM

## 2014-08-13 DIAGNOSIS — R55 Syncope and collapse: Secondary | ICD-10-CM

## 2014-08-13 NOTE — Telephone Encounter (Signed)
Left msg on patient's vm to return my call. 

## 2014-08-13 NOTE — Telephone Encounter (Signed)
I spoke with patient her f/u is scheduled for June 7th @ 11:30 am. She had also called back yesterday after she had gotten off the phone with Dr. Karel JarvisAquino with another question. Since it was after hours she got the after hours answering service. I asked her about that & she says she would just follow-up with that question at her f/u with Dr. Karel JarvisAquino.

## 2014-08-14 ENCOUNTER — Telehealth: Payer: Self-pay | Admitting: Family Medicine

## 2014-08-14 ENCOUNTER — Ambulatory Visit
Admission: RE | Admit: 2014-08-14 | Discharge: 2014-08-14 | Disposition: A | Discharge status: Home / Self Care | Payer: BLUE CROSS/BLUE SHIELD | Source: Ambulatory Visit | Attending: Neurology | Admitting: Neurology

## 2014-08-14 DIAGNOSIS — R404 Transient alteration of awareness: Secondary | ICD-10-CM

## 2014-08-14 MED ORDER — GADOBENATE DIMEGLUMINE 529 MG/ML IV SOLN
17.0000 mL | Freq: Once | INTRAVENOUS | Status: AC | PRN
  Administered 2014-08-14: 17 mL via INTRAVENOUS

## 2014-08-14 NOTE — Telephone Encounter (Signed)
Lmovm to return my call. 

## 2014-08-14 NOTE — Telephone Encounter (Signed)
-----   Message from Octaviano Battyebecca S Tat, DO sent at 08/14/2014 12:07 PM EDT ----- You can let pt know that her MRI brain was normal

## 2014-08-14 NOTE — Telephone Encounter (Signed)
Patient returned my call. Notified her of MRI results.

## 2014-08-18 ENCOUNTER — Other Ambulatory Visit: Payer: BLUE CROSS/BLUE SHIELD

## 2014-09-02 ENCOUNTER — Encounter: Payer: Self-pay | Admitting: Neurology

## 2014-09-02 ENCOUNTER — Ambulatory Visit (INDEPENDENT_AMBULATORY_CARE_PROVIDER_SITE_OTHER): Payer: BLUE CROSS/BLUE SHIELD | Admitting: Neurology

## 2014-09-02 VITALS — BP 118/80 | HR 79 | Wt 186.6 lb

## 2014-09-02 DIAGNOSIS — G40009 Localization-related (focal) (partial) idiopathic epilepsy and epileptic syndromes with seizures of localized onset, not intractable, without status epilepticus: Secondary | ICD-10-CM | POA: Diagnosis not present

## 2014-09-02 MED ORDER — TOPIRAMATE 25 MG PO TABS: ORAL_TABLET | ORAL | Status: DC
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
## 2014-09-22 ENCOUNTER — Telehealth: Payer: Self-pay | Admitting: *Deleted

## 2014-09-22 NOTE — Telephone Encounter (Signed)
Pls let her know there is no medication that she can take her drink with. If she really needs to have a drink, would minimize to 1-2 drinks a week. One of the potential triggers for seizures is alcohol. If she does have a drink, she should still take her medication. Thanks

## 2014-09-22 NOTE — Telephone Encounter (Signed)
Please advise 

## 2014-09-22 NOTE — Telephone Encounter (Signed)
Patient going on Vacation would like to know if she can drink with her Topamax if not is it anything she could take that she can drink with. Call back number [phone]

## 2014-09-22 NOTE — Telephone Encounter (Signed)
Lmovm to return my call. 

## 2014-09-23 NOTE — Telephone Encounter (Signed)
Patient was notified of advisement. 

## 2014-10-03 ENCOUNTER — Encounter: Payer: Self-pay | Admitting: Neurology

## 2014-10-03 ENCOUNTER — Ambulatory Visit (INDEPENDENT_AMBULATORY_CARE_PROVIDER_SITE_OTHER): Payer: BLUE CROSS/BLUE SHIELD | Admitting: Neurology

## 2014-10-03 VITALS — BP 122/82 | HR 84 | Resp 16 | Ht 66.0 in | Wt 182.0 lb

## 2014-10-03 DIAGNOSIS — G40009 Localization-related (focal) (partial) idiopathic epilepsy and epileptic syndromes with seizures of localized onset, not intractable, without status epilepticus: Secondary | ICD-10-CM | POA: Diagnosis not present

## 2014-10-03 MED ORDER — TOPIRAMATE 25 MG PO TABS: ORAL_TABLET | ORAL | Status: DC
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
## 2014-12-18 ENCOUNTER — Encounter: Payer: Self-pay | Admitting: Obstetrics

## 2014-12-18 ENCOUNTER — Ambulatory Visit (INDEPENDENT_AMBULATORY_CARE_PROVIDER_SITE_OTHER): Payer: Self-pay | Admitting: Obstetrics

## 2014-12-18 VITALS — BP 137/97 | HR 75 | Wt 177.0 lb

## 2014-12-18 DIAGNOSIS — N760 Acute vaginitis: Secondary | ICD-10-CM

## 2014-12-18 DIAGNOSIS — B9689 Other specified bacterial agents as the cause of diseases classified elsewhere: Secondary | ICD-10-CM

## 2014-12-18 DIAGNOSIS — A499 Bacterial infection, unspecified: Secondary | ICD-10-CM

## 2014-12-18 MED ORDER — TINIDAZOLE 500 MG PO TABS: 1000.0000 mg | ORAL_TABLET | Freq: Every day | ORAL | Status: DC

## 2014-12-18 NOTE — Progress Notes (Signed)
Patient ID: Veronica Stanton, female   DOB: Jun 08, 1980, 34 y.o.   MRN: [id number]  Chief Complaint  Patient presents with  . Vaginitis    HPI Veronica Stanton is a 34 y.o. female.  Had recent intercourse after nine years of abstinence.  Now has malodorous vaginal discharge.  Denies vaginal itching. HPI  Past Medical History  Diagnosis Date  . No diagnosis   . Palpitations     Past Surgical History  Procedure Laterality Date  . Negative      Family History  Problem Relation Age of Onset  . Stroke Neg Hx   . Heart disease Neg Hx   . Mental illness Neg Hx     Social History Social History  Substance Use Topics  . Smoking status: Never Smoker   . Smokeless tobacco: Never Used  . Alcohol Use: No    No Known Allergies  Current Outpatient Prescriptions  Medication Sig Dispense Refill  . topiramate (TOPAMAX) 25 MG tablet Take 3 tablets twice a day 180 tablet 6   No current facility-administered medications for this visit.    Review of Systems Review of Systems Constitutional: negative for fatigue and weight loss Respiratory: negative for cough and wheezing Cardiovascular: negative for chest pain, fatigue and palpitations Gastrointestinal: negative for abdominal pain and change in bowel habits Genitourinary: malodorous vaginal discharge Integument/breast: negative for nipple discharge Musculoskeletal:negative for myalgias Neurological: negative for gait problems and tremors Behavioral/Psych: negative for abusive relationship, depression Endocrine: negative for temperature intolerance     Blood pressure 137/97, pulse 75, weight 177 lb (80.287 kg), last menstrual period 12/10/2014.  Physical Exam Physical Exam:           General:  Alert and no distess Abdomen:  normal findings: no organomegaly, soft, non-tender and no hernia  Pelvis:  External genitalia: normal general appearance Urinary system: urethral meatus normal and bladder without fullness,  nontender Vaginal: normal without tenderness, induration or masses Cervix: normal appearance Adnexa: normal bimanual exam Uterus: anteverted and non-tender, normal size      Data Reviewed labs  Assessment     BV     Plan    Tindamax dispensed F/U for Annual and Pap   No orders of the defined types were placed in this encounter.   No orders of the defined types were placed in this encounter.

## 2014-12-30 ENCOUNTER — Ambulatory Visit (INDEPENDENT_AMBULATORY_CARE_PROVIDER_SITE_OTHER): Payer: BLUE CROSS/BLUE SHIELD | Admitting: Neurology

## 2014-12-30 ENCOUNTER — Encounter: Payer: Self-pay | Admitting: Neurology

## 2014-12-30 VITALS — BP 110/80 | HR 84 | Resp 16 | Ht 66.0 in | Wt 180.0 lb

## 2014-12-30 DIAGNOSIS — G40009 Localization-related (focal) (partial) idiopathic epilepsy and epileptic syndromes with seizures of localized onset, not intractable, without status epilepticus: Secondary | ICD-10-CM

## 2014-12-30 MED ORDER — TOPIRAMATE 25 MG PO TABS: ORAL_TABLET | ORAL | Status: DC
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
## 2015-07-01 ENCOUNTER — Encounter: Payer: Self-pay | Admitting: Neurology

## 2015-07-01 ENCOUNTER — Ambulatory Visit (INDEPENDENT_AMBULATORY_CARE_PROVIDER_SITE_OTHER): Payer: BLUE CROSS/BLUE SHIELD | Admitting: Neurology

## 2015-07-01 VITALS — BP 116/82 | HR 75 | Resp 16 | Ht 66.0 in | Wt 182.0 lb

## 2015-07-01 DIAGNOSIS — G40009 Localization-related (focal) (partial) idiopathic epilepsy and epileptic syndromes with seizures of localized onset, not intractable, without status epilepticus: Secondary | ICD-10-CM

## 2015-07-01 MED ORDER — TOPIRAMATE 25 MG PO TABS: ORAL_TABLET | ORAL | Status: DC
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
## 2015-11-23 ENCOUNTER — Ambulatory Visit: Payer: BLUE CROSS/BLUE SHIELD | Admitting: Neurology

## 2015-12-07 ENCOUNTER — Encounter: Payer: Self-pay | Admitting: Neurology

## 2015-12-07 ENCOUNTER — Ambulatory Visit (INDEPENDENT_AMBULATORY_CARE_PROVIDER_SITE_OTHER): Payer: BLUE CROSS/BLUE SHIELD | Admitting: Neurology

## 2015-12-07 VITALS — BP 118/78 | HR 96 | Temp 98.2°F | Wt 181.1 lb

## 2015-12-07 DIAGNOSIS — G40009 Localization-related (focal) (partial) idiopathic epilepsy and epileptic syndromes with seizures of localized onset, not intractable, without status epilepticus: Secondary | ICD-10-CM | POA: Diagnosis not present

## 2015-12-07 MED ORDER — TOPIRAMATE ER 100 MG PO SPRINKLE CAP24: 1.0000 | EXTENDED_RELEASE_CAPSULE | Freq: Every day | ORAL | 0 refills | Status: DC

## 2015-12-07 MED ORDER — TOPIRAMATE ER 50 MG PO SPRINKLE CAP24: 1.0000 | EXTENDED_RELEASE_CAPSULE | Freq: Every day | ORAL | 1 refills | Status: DC
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
## 2015-12-08 ENCOUNTER — Encounter: Payer: Self-pay | Admitting: Neurology

## 2016-02-11 ENCOUNTER — Telehealth: Payer: Self-pay | Admitting: Family Medicine

## 2016-02-11 NOTE — Telephone Encounter (Signed)
Relation to ZO:XWRUpt:self Call back number:[phone]   Reason for call:  Patient would like to reestablish with Dr. Laury AxonLowne, patient states Dr. Laury AxonLowne referred her to neurologist who she has been seeing for a few years and would like to see Dr. Laury AxonLowne before January, please advise

## 2016-02-11 NOTE — Telephone Encounter (Signed)
Put her in on dec 29  :)

## 2016-02-12 NOTE — Telephone Encounter (Signed)
Patient informed will call back to schedule

## 2016-06-02 ENCOUNTER — Ambulatory Visit (INDEPENDENT_AMBULATORY_CARE_PROVIDER_SITE_OTHER): Payer: BLUE CROSS/BLUE SHIELD | Admitting: Neurology

## 2016-06-02 ENCOUNTER — Encounter: Payer: Self-pay | Admitting: Neurology

## 2016-06-02 VITALS — BP 100/64 | HR 76 | Ht 66.0 in | Wt 190.1 lb

## 2016-06-02 DIAGNOSIS — R29898 Other symptoms and signs involving the musculoskeletal system: Secondary | ICD-10-CM | POA: Diagnosis not present

## 2016-06-02 DIAGNOSIS — G40009 Localization-related (focal) (partial) idiopathic epilepsy and epileptic syndromes with seizures of localized onset, not intractable, without status epilepticus: Secondary | ICD-10-CM | POA: Diagnosis not present

## 2016-06-02 MED ORDER — TOPIRAMATE 25 MG PO TABS: ORAL_TABLET | ORAL | 11 refills | Status: DC
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
## 2016-06-03 ENCOUNTER — Other Ambulatory Visit: Payer: Self-pay | Admitting: *Deleted

## 2016-06-03 DIAGNOSIS — R200 Anesthesia of skin: Secondary | ICD-10-CM

## 2016-06-08 ENCOUNTER — Encounter: Payer: Self-pay | Admitting: Neurology

## 2016-06-08 DIAGNOSIS — R29898 Other symptoms and signs involving the musculoskeletal system: Secondary | ICD-10-CM | POA: Insufficient documentation

## 2016-06-14 ENCOUNTER — Encounter: Payer: BLUE CROSS/BLUE SHIELD | Admitting: Neurology

## 2016-12-09 ENCOUNTER — Ambulatory Visit: Payer: BLUE CROSS/BLUE SHIELD | Admitting: Neurology

## 2016-12-23 ENCOUNTER — Encounter: Payer: Self-pay | Admitting: Neurology

## 2016-12-23 ENCOUNTER — Ambulatory Visit (INDEPENDENT_AMBULATORY_CARE_PROVIDER_SITE_OTHER): Payer: BLUE CROSS/BLUE SHIELD | Admitting: Neurology

## 2016-12-23 VITALS — BP 102/60 | HR 88 | Ht 67.0 in | Wt 180.0 lb

## 2016-12-23 DIAGNOSIS — G40009 Localization-related (focal) (partial) idiopathic epilepsy and epileptic syndromes with seizures of localized onset, not intractable, without status epilepticus: Secondary | ICD-10-CM

## 2016-12-23 DIAGNOSIS — R4789 Other speech disturbances: Secondary | ICD-10-CM | POA: Diagnosis not present

## 2016-12-23 MED ORDER — TOPIRAMATE 25 MG PO TABS: ORAL_TABLET | ORAL | 11 refills | Status: DC
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
## 2017-01-18 ENCOUNTER — Other Ambulatory Visit: Payer: BLUE CROSS/BLUE SHIELD

## 2017-02-27 ENCOUNTER — Ambulatory Visit: Payer: Self-pay | Admitting: *Deleted

## 2017-02-27 NOTE — Telephone Encounter (Signed)
Pt sneezed and develop a pain in her lower back that radiates down into her buttocks and legs.  Home care advise given to patient.  Appt made.  Reason for Disposition . [1] Pain radiates into the thigh or further down the leg AND [2] one leg  Answer Assessment - Initial Assessment Questions 1. ONSET: "When did the pain begin?"      This morning 2. LOCATION: "Where does it hurt?" (upper, mid or lower back)     lower 3. SEVERITY: "How bad is the pain?"  (e.g., Scale 1-10; mild, moderate, or severe)   - MILD (1-3): doesn't interfere with normal activities    - MODERATE (4-7): interferes with normal activities or awakens from sleep    - SEVERE (8-10): excruciating pain, unable to do any normal activities      6 4. PATTERN: "Is the pain constant?" (e.g., yes, no; constant, intermittent)      intermittent 5. RADIATION: "Does the pain shoot into your legs or elsewhere?"     Goes into buttocks 6. CAUSE:  "What do you think is causing the back pain?"      From a sneeze 7. BACK OVERUSE:  "Any recent lifting of heavy objects, strenuous work or exercise?"     Getting christmas tree down by herself 8. MEDICATIONS: "What have you taken so far for the pain?" (e.g., nothing, acetaminophen, NSAIDS)     no 9. NEUROLOGIC SYMPTOMS: "Do you have any weakness, numbness, or problems with bowel/bladder control?"     no 10. OTHER SYMPTOMS: "Do you have any other symptoms?" (e.g., fever, abdominal pain, burning with urination, blood in urine)       no 11. PREGNANCY: "Is there any chance you are pregnant?" (e.g., yes, no; LMP)       no  Protocols used: BACK PAIN-A-AH

## 2017-02-28 ENCOUNTER — Encounter: Payer: Self-pay | Admitting: Family Medicine

## 2017-02-28 ENCOUNTER — Ambulatory Visit (HOSPITAL_BASED_OUTPATIENT_CLINIC_OR_DEPARTMENT_OTHER)
Admission: RE | Admit: 2017-02-28 | Discharge: 2017-02-28 | Disposition: A | Discharge status: Home / Self Care | Payer: BLUE CROSS/BLUE SHIELD | Source: Ambulatory Visit | Attending: Family Medicine | Admitting: Family Medicine

## 2017-02-28 ENCOUNTER — Ambulatory Visit (INDEPENDENT_AMBULATORY_CARE_PROVIDER_SITE_OTHER): Payer: BLUE CROSS/BLUE SHIELD | Admitting: Family Medicine

## 2017-02-28 VITALS — BP 108/78 | HR 82 | Temp 98.8°F | Ht 67.0 in | Wt 182.0 lb

## 2017-02-28 DIAGNOSIS — N644 Mastodynia: Secondary | ICD-10-CM | POA: Insufficient documentation

## 2017-02-28 DIAGNOSIS — R079 Chest pain, unspecified: Secondary | ICD-10-CM

## 2017-02-28 DIAGNOSIS — R0781 Pleurodynia: Secondary | ICD-10-CM | POA: Insufficient documentation

## 2017-02-28 NOTE — Progress Notes (Signed)
Subjective:  I acted as a Neurosurgeonscribe for Lubrizol CorporationDr.Lowne-Chase  Toya, CMA   Patient ID: Veronica Stanton, female    DOB: [birth year], 36 y.o.   MRN: [id number]  Chief Complaint  Patient presents with  . Establish Care  . Breast Pain    Pain under left breast. Onset about 2 and a half weeks ago.     HPI  Patient is in today for c/o L breast pain--- she had one episode of chest pain - -under L breast -- it only lasted a few seconds.  No palpitations, no sob.  No radiation of the pain.     Patient Care Team: Zola ButtonLowne Chase, Grayling CongressYvonne R, DO as PCP - General (Family Medicine)   Past Medical History:  Diagnosis Date  . No diagnosis   . Palpitations     Past Surgical History:  Procedure Laterality Date  . negative      Family History  Problem Relation Age of Onset  . Stroke Neg Hx   . Heart disease Neg Hx   . Mental illness Neg Hx     Social History   Socioeconomic History  . Marital status: Single    Spouse name: Not on file  . Number of children: Not on file  . Years of education: Not on file  . Highest education level: Not on file  Social Needs  . Financial resource strain: Not on file  . Food insecurity - worry: Not on file  . Food insecurity - inability: Not on file  . Transportation needs - medical: Not on file  . Transportation needs - non-medical: Not on file  Occupational History  . Not on file  Tobacco Use  . Smoking status: Never Smoker  . Smokeless tobacco: Never Used  Substance and Sexual Activity  . Alcohol use: No    Alcohol/week: 0.0 oz  . Drug use: No  . Sexual activity: Not on file  Other Topics Concern  . Not on file  Social History Narrative  . Not on file    Outpatient Medications Prior to Visit  Medication Sig Dispense Refill  . topiramate (TOPAMAX) 25 MG tablet Take 3 tablets twice a day 180 tablet 11   No facility-administered medications prior to visit.     No Known Allergies  Review of Systems  Constitutional: Negative for chills, fever  and malaise/fatigue.  HENT: Negative for congestion and hearing loss.   Eyes: Negative for discharge.  Respiratory: Negative for cough, sputum production and shortness of breath.   Cardiovascular: Negative for chest pain, palpitations and leg swelling.  Gastrointestinal: Negative for abdominal pain, blood in stool, constipation, diarrhea, heartburn, nausea and vomiting.  Genitourinary: Negative for dysuria, frequency, hematuria and urgency.       L breast pain  Musculoskeletal: Negative for back pain, falls and myalgias.  Skin: Negative for rash.  Neurological: Negative for dizziness, sensory change, loss of consciousness, weakness and headaches.  Endo/Heme/Allergies: Negative for environmental allergies. Does not bruise/bleed easily.  Psychiatric/Behavioral: Negative for depression and suicidal ideas. The patient is not nervous/anxious and does not have insomnia.        Objective:    Physical Exam  Constitutional: She is oriented to person, place, and time. She appears well-developed and well-nourished.  HENT:  Head: Normocephalic and atraumatic.  Eyes: Conjunctivae and EOM are normal.  Neck: Normal range of motion. Neck supple. No JVD present. Carotid bruit is not present. No thyromegaly present.  Cardiovascular: Normal rate, regular rhythm and normal heart  sounds.  No murmur heard. Pulmonary/Chest: Effort normal and breath sounds normal. No respiratory distress. She has no wheezes. She has no rales. She exhibits no tenderness. Right breast exhibits no inverted nipple, no mass, no nipple discharge, no skin change and no tenderness. Left breast exhibits no inverted nipple, no mass, no nipple discharge, no skin change and no tenderness. Breasts are symmetrical.    Genitourinary: No breast swelling, tenderness, discharge or bleeding.  Musculoskeletal: She exhibits no edema.  Neurological: She is alert and oriented to person, place, and time.  Psychiatric: She has a normal mood and  affect.  Nursing note and vitals reviewed.   BP 108/78   Pulse 82   Temp 98.8 F (37.1 C) (Oral)   Ht 5\' 7"  (1.702 m)   Wt 182 lb (82.6 kg)   LMP 02/14/2017 (Approximate)   SpO2 99%   BMI 28.51 kg/m  Wt Readings from Last 3 Encounters:  02/28/17 182 lb (82.6 kg)  12/23/16 180 lb (81.6 kg)  06/02/16 190 lb 1 oz (86.2 kg)   BP Readings from Last 3 Encounters:  02/28/17 108/78  12/23/16 102/60  06/02/16 100/64      There is no immunization history on file for this patient.  Health Maintenance  Topic Date Due  . HIV Screening  08/21/1993  . TETANUS/TDAP  08/21/1997  . PAP SMEAR  08/22/1999  . INFLUENZA VACCINE  10/26/2016    Lab Results  Component Value Date   WBC 6.4 07/19/2014   HGB 11.9 (L) 07/19/2014   HCT 34.6 (L) 07/19/2014   PLT 194 07/19/2014   GLUCOSE 101 (H) 07/19/2014   NA 134 (L) 07/19/2014   K 3.5 07/19/2014   CL 105 07/19/2014   CREATININE 0.82 07/19/2014   BUN 10 07/19/2014   CO2 24 07/19/2014   TSH 0.82 10/19/2011    Lab Results  Component Value Date   TSH 0.82 10/19/2011   Lab Results  Component Value Date   WBC 6.4 07/19/2014   HGB 11.9 (L) 07/19/2014   HCT 34.6 (L) 07/19/2014   MCV 91.1 07/19/2014   PLT 194 07/19/2014   Lab Results  Component Value Date   NA 134 (L) 07/19/2014   K 3.5 07/19/2014   CO2 24 07/19/2014   GLUCOSE 101 (H) 07/19/2014   BUN 10 07/19/2014   CREATININE 0.82 07/19/2014   CALCIUM 8.4 07/19/2014   ANIONGAP 5 07/19/2014   GFR 109.28 10/19/2011   No results found for: CHOL No results found for: HDL No results found for: LDLCALC No results found for: TRIG No results found for: CHOLHDL No results found for: [id number]     ekg--NSR   Assessment & Plan:   Problem List Items Addressed This Visit    None    Visit Diagnoses    Chest pain, unspecified type    -  Primary   Relevant Orders   EKG 12-Lead (Completed)   DG Ribs Unilateral W/Chest Left   Breast pain, left       Relevant Orders   MM  Digital Diagnostic Bilat   DG Ribs Unilateral W/Chest Left      I am having Veronica Stanton maintain her topiramate.  No orders of the defined types were placed in this encounter.   CMA served as Neurosurgeonscribe during this visit. History, Physical and Plan performed by medical provider. Documentation and orders reviewed and attested to.  Donato SchultzYvonne R Lowne Chase, DO

## 2017-02-28 NOTE — Patient Instructions (Signed)
Chest Wall Pain °Chest wall pain is pain in or around the bones and muscles of your chest. Sometimes, an injury causes this pain. Sometimes, the cause may not be known. This pain may take several weeks or longer to get better. °Follow these instructions at home: °Pay attention to any changes in your symptoms. Take these actions to help with your pain: °· Rest as told by your health care provider. °· Avoid activities that cause pain. These include any activities that use your chest muscles or your abdominal and side muscles to lift heavy items. °· If directed, apply ice to the painful area: °¨ Put ice in a plastic bag. °¨ Place a towel between your skin and the bag. °¨ Leave the ice on for 20 minutes, 2-3 times per day. °· Take over-the-counter and prescription medicines only as told by your health care provider. °· Do not use tobacco products, including cigarettes, chewing tobacco, and e-cigarettes. If you need help quitting, ask your health care provider. °· Keep all follow-up visits as told by your health care provider. This is important. °Contact a health care provider if: °· You have a fever. °· Your chest pain becomes worse. °· You have new symptoms. °Get help right away if: °· You have nausea or vomiting. °· You feel sweaty or light-headed. °· You have a cough with phlegm (sputum) or you cough up blood. °· You develop shortness of breath. °This information is not intended to replace advice given to you by your health care provider. Make sure you discuss any questions you have with your health care provider. °Document Released: 03/14/2005 Document Revised: 07/23/2015 Document Reviewed: 06/09/2014 °Elsevier Interactive Patient Education © 2017 [employer] Inc. ° °

## 2017-03-08 ENCOUNTER — Other Ambulatory Visit: Payer: Self-pay

## 2017-03-08 DIAGNOSIS — G40009 Localization-related (focal) (partial) idiopathic epilepsy and epileptic syndromes with seizures of localized onset, not intractable, without status epilepticus: Secondary | ICD-10-CM

## 2017-03-08 MED ORDER — TOPIRAMATE 50 MG PO TABS: ORAL_TABLET | ORAL | 6 refills | Status: DC

## 2017-03-25 ENCOUNTER — Other Ambulatory Visit: Payer: Self-pay | Admitting: Family Medicine

## 2017-03-25 DIAGNOSIS — N644 Mastodynia: Secondary | ICD-10-CM

## 2017-04-05 ENCOUNTER — Other Ambulatory Visit: Payer: BLUE CROSS/BLUE SHIELD

## 2017-04-10 ENCOUNTER — Ambulatory Visit: Payer: BLUE CROSS/BLUE SHIELD | Admitting: Neurology

## 2017-04-18 DIAGNOSIS — F329 Major depressive disorder, single episode, unspecified: Secondary | ICD-10-CM | POA: Diagnosis not present

## 2017-04-27 ENCOUNTER — Encounter: Payer: Self-pay | Admitting: Internal Medicine

## 2017-04-27 ENCOUNTER — Ambulatory Visit: Payer: BLUE CROSS/BLUE SHIELD | Admitting: Internal Medicine

## 2017-04-27 VITALS — BP 124/80 | HR 76 | Temp 98.3°F | Resp 14 | Ht 67.0 in | Wt 186.1 lb

## 2017-04-27 DIAGNOSIS — F419 Anxiety disorder, unspecified: Secondary | ICD-10-CM | POA: Diagnosis not present

## 2017-04-27 DIAGNOSIS — G4452 New daily persistent headache (NDPH): Secondary | ICD-10-CM | POA: Diagnosis not present

## 2017-04-27 DIAGNOSIS — F329 Major depressive disorder, single episode, unspecified: Secondary | ICD-10-CM | POA: Diagnosis not present

## 2017-04-27 MED ORDER — ESCITALOPRAM OXALATE 10 MG PO TABS: 10.0000 mg | ORAL_TABLET | Freq: Every day | ORAL | 1 refills | Status: DC

## 2017-04-27 NOTE — Progress Notes (Signed)
Pre visit review using our clinic review tool, if applicable. No additional management support is needed unless otherwise documented below in the visit note. 
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
## 2017-04-27 NOTE — Patient Instructions (Signed)
  GO TO THE FRONT DESK Schedule your next appointment for a  Check up in 4 weeks   Start Lexapro 10 mg 1 tablet at night  Okay to take OTC melatonin every night to help his sleep  Continue counseling  We will schedule CT of the head due to your recent headache  If you have as severe, intense or unusual headache: Seek medical attention

## 2017-05-06 ENCOUNTER — Encounter (HOSPITAL_BASED_OUTPATIENT_CLINIC_OR_DEPARTMENT_OTHER): Payer: Self-pay

## 2017-05-06 ENCOUNTER — Ambulatory Visit (HOSPITAL_BASED_OUTPATIENT_CLINIC_OR_DEPARTMENT_OTHER): Payer: BLUE CROSS/BLUE SHIELD

## 2017-05-15 DIAGNOSIS — F329 Major depressive disorder, single episode, unspecified: Secondary | ICD-10-CM | POA: Diagnosis not present

## 2017-05-26 ENCOUNTER — Ambulatory Visit: Payer: BLUE CROSS/BLUE SHIELD | Admitting: Internal Medicine

## 2017-05-26 DIAGNOSIS — Z0289 Encounter for other administrative examinations: Secondary | ICD-10-CM

## 2017-05-29 ENCOUNTER — Telehealth: Payer: Self-pay | Admitting: Neurology

## 2017-05-29 NOTE — Telephone Encounter (Signed)
Pt called and said she wanted a call back concerning some new thing going on that she is concerned about and wanted to run it by the nurse

## 2017-05-30 ENCOUNTER — Encounter: Payer: Self-pay | Admitting: Family Medicine

## 2017-05-31 DIAGNOSIS — F329 Major depressive disorder, single episode, unspecified: Secondary | ICD-10-CM | POA: Diagnosis not present

## 2017-06-06 ENCOUNTER — Encounter: Payer: Self-pay | Admitting: Neurology

## 2017-06-06 ENCOUNTER — Other Ambulatory Visit: Payer: Self-pay

## 2017-06-06 ENCOUNTER — Ambulatory Visit: Payer: BLUE CROSS/BLUE SHIELD | Admitting: Neurology

## 2017-06-06 VITALS — BP 116/78 | HR 110 | Ht 67.0 in | Wt 190.0 lb

## 2017-06-06 DIAGNOSIS — G40009 Localization-related (focal) (partial) idiopathic epilepsy and epileptic syndromes with seizures of localized onset, not intractable, without status epilepticus: Secondary | ICD-10-CM

## 2017-06-06 DIAGNOSIS — F419 Anxiety disorder, unspecified: Secondary | ICD-10-CM | POA: Diagnosis not present

## 2017-06-06 MED ORDER — TOPIRAMATE 25 MG PO TABS: ORAL_TABLET | ORAL | 11 refills | Status: DC

## 2017-06-06 NOTE — Patient Instructions (Signed)
1. Continue Topamax 25mg : Take 3 tablets twice a day 2. Recommend starting the Lexapro 10mg  daily 3. Continue with seeing the therapist 4. If you would like to try the phentermine, would do low dose and see how you do 5. Follow-up in 6 months, call for any changes  Seizure Precautions: 1. If medication has been prescribed for you to prevent seizures, take it exactly as directed.  Do not stop taking the medicine without talking to your doctor first, even if you have not had a seizure in a long time.   2. Avoid activities in which a seizure would cause danger to yourself or to others.  Don't operate dangerous machinery, swim alone, or climb in high or dangerous places, such as on ladders, roofs, or girders.  Do not drive unless your doctor says you may.  3. If you have any warning that you may have a seizure, lay down in a safe place where you can't hurt yourself.    4.  No driving for 6 months from last seizure, [place].   Please refer to the following link on the [employer]'s website for more information: http://www.epilepsyfoundation.org/answerplace/Social/driving/drivingu.cfm   5.  Maintain good sleep hygiene. Avoid alcohol.  6.  Notify your neurology if you are planning pregnancy or if you become pregnant.  7.  Contact your doctor if you have any problems that may be related to the medicine you are taking.  8.  Call 911 and bring the patient back to the ED if:        A.  The seizure lasts longer than 5 minutes.       B.  The patient doesn't awaken shortly after the seizure  C.  The patient has new problems such as difficulty seeing, speaking or moving  D.  The patient was injured during the seizure  E.  The patient has a temperature over 102 F (39C)  F.  The patient vomited and now is having trouble breathing

## 2017-06-06 NOTE — Progress Notes (Signed)
NEUROLOGY FOLLOW UP OFFICE NOTE  Veronica Stanton [id number]  DOB: 02-Jan-1981  HISTORY OF PRESENT ILLNESS: I had the pleasure of seeing Veronica Stanton in follow-up in the neurology clinic on 06/06/2017.  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 She continues on Topamax 75mg  BID with no side effects. She denies any further episodes of loss of consciousness, visual hallucinations, or weird, scared feeling since April 2016. She comes today slightly distressed and emotional, reporting that her workplace is stressing her out and taking a toll on her. She thinks she has anxiety. She has always been confident and never had any issues with communication, but now when she talks it feels like things are about to explode, she is stuttering and feels like her head is twitching. She tries to sit still and feels like her eyes start blinking as if she cannot control it. She switches position and rubs her neck to relax/calm down, then has to leave the room. After around 7 minutes, she feels better and comes back. She feels tremulous when trying to reach for water, and is not sure if the people around her see her head trembling.This never happens at home, they only happen at work. She was driving the other day then felt anxious all of a sudden. She calmed herself down and drank soda, then felt better. She feels that all of this has built up in the past year, her boss changed her schedule that she cannot bring her daughter to school. She has a prescription for Lexapro but has not started it yet due to concern for side effects (particularly weight gain). She denies any headaches, dizziness, blurred vision, staring/unresponsiveness, no gaps in time, olfactory/gustatory hallucinations, deja vu, rising epigastric sensation, myoclonic jerks. No falls.   HPI: This is a very pleasant 37 yo RH woman with no significant past medical who had an episode of loss of  consciousness with urinary incontinence last 07/19/14. She reports an evaluation in 2013 for palpitations that started at age 37 where palpitations would wake her up, lasting 5 minutes. She was evaluated by Cardiology at that time, with normal echocardiogram and 48-hour Holter monitor. She denies any further episodes of palpitations in the past 3 years. She also reports episodes that wake her up from night when she would see writing on the wall, described as black cursive writing. These were separate from the palpitations. She has had only one episode of palpitations during wakefulness that occurred while driving 3 years ago. She denies any visual symptoms during daytime. No associated confusion. On 07/19/14, she recalls waking up and seeing the black cursive writing on the wall. She then has no recollection of events until EMS was there. Her daughter was sleeping in her room and heard her make funny noises, open her eyes and get up to go to the bathroom. She then fell to the floor, no report of convulsive activity. She had urinary incontinence, no tongue bite. When EMS arrived, she reports being aware of her surroundings, and recalls cleaning herself up before [facility]. In the ER, CBC and BMP were unremarkable. EKG reported sinus tachycardia, nonspecific T wave abnormality. She was discharged home. She denied any alcohol intake, no sleep deprivation.  She reports episodes once a month where she feels a "weird feeling, like you're scared and would dream something." This lasts around 2 seconds and she states she has "learned to control" them. The last episode  was in May 2016.   Epilepsy Risk Factors: She had a normal birth and early development. There is no history of febrile convulsions, CNS infections such as meningitis/encephalitis, significant traumatic brain injury, neurosurgical procedures, or family history of seizures.  Diagnostic Data: I personally reviewed her routine EEG which was normal. Her  24-hour EEG was abnormal, with occasional right anterior temporal sharp waves seen exclusively in sleep.  Her MRI brain with and without contrast was normal, hippocampi symmetric with no abnormal signal or enhancement seen.   PAST MEDICAL HISTORY: Past Medical History:  Diagnosis Date  . No diagnosis   . Palpitations     MEDICATIONS: Current Outpatient Medications on File Prior to Visit  Medication Sig Dispense Refill  . escitalopram (LEXAPRO) 10 MG tablet Take 1 tablet (10 mg total) by mouth daily. 30 tablet 1  . topiramate (TOPAMAX) 50 MG tablet Take 1 1/2  tablets twice a day 90 tablet 6   No current facility-administered medications on file prior to visit.     ALLERGIES: No Known Allergies  FAMILY HISTORY: Family History  Problem Relation Age of Onset  . Stroke Neg Hx   . Heart disease Neg Hx   . Mental illness Neg Hx     SOCIAL HISTORY: Social History   Socioeconomic History  . Marital status: Single    Spouse name: Not on file  . Number of children: Not on file  . Years of education: Not on file  . Highest education level: Not on file  Social Needs  . Financial resource strain: Not on file  . Food insecurity - worry: Not on file  . Food insecurity - inability: Not on file  . Transportation needs - medical: Not on file  . Transportation needs - non-medical: Not on file  Occupational History  . Not on file  Tobacco Use  . Smoking status: Never Smoker  . Smokeless tobacco: Never Used  Substance and Sexual Activity  . Alcohol use: No    Alcohol/week: 0.0 oz  . Drug use: No  . Sexual activity: Not on file  Other Topics Concern  . Not on file  Social History Narrative  . Not on file    REVIEW OF SYSTEMS: Constitutional: No fevers, chills, or sweats, no generalized fatigue, change in appetite Eyes: No visual changes, double vision, eye pain Ear, nose and throat: No hearing loss, ear pain, nasal congestion, sore throat Cardiovascular: No chest pain,  palpitations Respiratory:  No shortness of breath at rest or with exertion, wheezes GastrointestinaI: No nausea, vomiting, diarrhea, abdominal pain, fecal incontinence Genitourinary:  No dysuria, urinary retention or frequency Musculoskeletal:  No neck pain, back pain Integumentary: No rash, pruritus, skin lesions Neurological: as above Psychiatric: No depression, insomnia, anxiety Endocrine: No palpitations, fatigue, diaphoresis, mood swings, change in appetite, change in weight, increased thirst Hematologic/Lymphatic:  No anemia, purpura, petechiae. Allergic/Immunologic: no itchy/runny eyes, nasal congestion, recent allergic reactions, rashes  PHYSICAL EXAM: Vitals:   06/06/17 1509  BP: 116/78  Pulse: (!) 110  SpO2: 97%   General: No acute distress, became tearful Head:  Normocephalic/atraumatic Neck: supple, no paraspinal tenderness, full range of motion Heart:  Regular rate and rhythm Lungs:  Clear to auscultation bilaterally Back: No paraspinal tenderness Skin/Extremities: No rash, no edema Neurological Exam: alert and oriented to person, place, and time. No aphasia or dysarthria. Fund of knowledge is appropriate.  Recent and remote memory are intact.3/3 delayed recall. Attention and concentration are normal.    Able to name  objects and repeat phrases. Cranial nerves: Pupils equal, round, reactive to light.  Extraocular movements intact with no nystagmus. Visual fields full. Facial sensation intact. No facial asymmetry. Tongue, uvula, palate midline.  Motor: Bulk and tone normal, muscle strength 5/5 throughout with no pronator drift.  Sensation to light touch, pin, cold intact.  No extinction to double simultaneous stimulation.  Deep tendon reflexes 2+ throughout, toes downgoing.  Finger to nose testing intact.  Gait narrow-based and steady, able to tandem walk adequately.  Romberg negative.   IMPRESSION: This is a very pleasant 37 yo RH woman with an episode of loss of  consciousness last 07/19/14. This was preceded by visual hallucinations of seeing black cursive writing on the wall. She reports these visual symptoms started at age 72, waking her up from sleep. She had also reported recurrent episodes of a "weird scared feeling" once a month. 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 Her 24-hour EEG had shown occasional right anterior temporal epileptiform discharges exclusively in sleep. No electrographic seizures. MRI brain normal. No further seizures since April 2016. She is taking Topamax 75mg  BID without significant side effects. She is reporting several symptoms today that may relate to anxiety/stress. We discussed starting the Lexapro as prescribed by her PCP, and continue follow-up with her therapist. She is interested in looking for other jobs. She again asked about phentermine for weight loss, we discussed how it is a stimulant, and trying a low dose first. She is aware of [place] driving laws to stop driving after an episode of loss of consciousness, until 6 months event-free. She will follow-up in 6 months and knows to call our office for any problems.   Thank you for allowing me to participate in her care.  Please do not hesitate to call for any questions or concerns.  The duration of this appointment visit was 25 minutes of face-to-face time with the patient.  Greater than 50% of this time was spent in counseling, explanation of diagnosis, planning of further management, and coordination of care.   Patrcia Dolly, M.D.   CC: Dr. Zola Button

## 2017-06-29 DIAGNOSIS — F329 Major depressive disorder, single episode, unspecified: Secondary | ICD-10-CM | POA: Diagnosis not present

## 2017-07-27 ENCOUNTER — Telehealth: Payer: Self-pay | Admitting: Neurology

## 2017-07-27 NOTE — Telephone Encounter (Signed)
Patient wants to speak with someone about a dosage change short term for weight lost

## 2017-07-27 NOTE — Telephone Encounter (Signed)
Returned call.  No answer.  LMOM asking for return call.  

## 2017-07-31 ENCOUNTER — Ambulatory Visit: Payer: BLUE CROSS/BLUE SHIELD | Admitting: Family

## 2017-07-31 ENCOUNTER — Encounter: Payer: Self-pay | Admitting: Family

## 2017-07-31 ENCOUNTER — Telehealth: Payer: Self-pay

## 2017-07-31 VITALS — BP 110/82 | HR 100 | Temp 98.3°F | Resp 16 | Ht 67.0 in | Wt 191.4 lb

## 2017-07-31 DIAGNOSIS — R350 Frequency of micturition: Secondary | ICD-10-CM

## 2017-07-31 DIAGNOSIS — N3000 Acute cystitis without hematuria: Secondary | ICD-10-CM | POA: Diagnosis not present

## 2017-07-31 LAB — POC URINALSYSI DIPSTICK (AUTOMATED)
Bilirubin, UA: NEGATIVE
GLUCOSE UA: NEGATIVE
Ketones, UA: NEGATIVE
Leukocytes, UA: NEGATIVE
NITRITE UA: NEGATIVE
PH UA: 6 (ref 5.0–8.0)
PROTEIN UA: NEGATIVE
RBC UA: NEGATIVE
SPEC GRAV UA: 1.025 (ref 1.010–1.025)
Urobilinogen, UA: 1 E.U./dL

## 2017-07-31 MED ORDER — NITROFURANTOIN MONOHYD MACRO 100 MG PO CAPS: 100.0000 mg | ORAL_CAPSULE | Freq: Two times a day (BID) | ORAL | 0 refills | Status: DC

## 2017-07-31 NOTE — Telephone Encounter (Signed)
Spoke with pt.  She asks that Dr. Karel Jarvis write a letter/note for [facility] stating that it is OK for pt to take half dose of Phentermine.  Per pt this is ok with DR. Aquino.  Let pt know I would send Dr. Karel Jarvis a message and return call with response.

## 2017-07-31 NOTE — Telephone Encounter (Signed)
Please see previous phone encounter.

## 2017-07-31 NOTE — Patient Instructions (Addendum)
Please begin macrobid (antibiotic) for urinary tract infection. Call if new/worsening symptoms or if symptoms are not improved in 3 days.  Pt is advised to call if new/worsening symptoms, fever or if symptoms are not improved in 2-3 days.

## 2017-07-31 NOTE — Progress Notes (Signed)
Subjective:    Patient ID: Veronica Stanton, female    DOB: 05-18-1980, 37 y.o.   MRN: [id number]  HPI   Veronica Stanton is a 37 yr old female who presents today with c/o urinary frequency/pelvic pain and right sided low back pain.  Symptoms began 2 weeks ago.  Using AZO with only short term relief.  Also using cranberry juice without improvement.     Review of Systems    see HPI  Past Medical History:  Diagnosis Date  . No diagnosis   . Palpitations      Social History   Socioeconomic History  . Marital status: Single    Spouse name: Not on file  . Number of children: Not on file  . Years of education: Not on file  . Highest education level: Not on file  Occupational History  . Not on file  Social Needs  . Financial resource strain: Not on file  . Food insecurity:    Worry: Not on file    Inability: Not on file  . Transportation needs:    Medical: Not on file    Non-medical: Not on file  Tobacco Use  . Smoking status: Never Smoker  . Smokeless tobacco: Never Used  Substance and Sexual Activity  . Alcohol use: No    Alcohol/week: 0.0 oz  . Drug use: No  . Sexual activity: Not on file  Lifestyle  . Physical activity:    Days per week: Not on file    Minutes per session: Not on file  . Stress: Not on file  Relationships  . Social connections:    Talks on phone: Not on file    Gets together: Not on file    Attends religious service: Not on file    Active member of club or organization: Not on file    Attends meetings of clubs or organizations: Not on file    Relationship status: Not on file  . Intimate partner violence:    Fear of current or ex partner: Not on file    Emotionally abused: Not on file    Physically abused: Not on file    Forced sexual activity: Not on file  Other Topics Concern  . Not on file  Social History Narrative  . Not on file    Past Surgical History:  Procedure Laterality Date  . negative      Family History  Problem  Relation Age of Onset  . Stroke Neg Hx   . Heart disease Neg Hx   . Mental illness Neg Hx     No Known Allergies  Current Outpatient Medications on File Prior to Visit  Medication Sig Dispense Refill  . topiramate (TOPAMAX) 25 MG tablet Take 3 tablets twice a day 180 tablet 11   No current facility-administered medications on file prior to visit.     BP 110/82 (BP Location: Right Arm, Cuff Size: Large)   Pulse 100   Temp 98.3 F (36.8 C) (Oral)   Resp 16   Ht  (1.702 m)   Wt 191 lb 6.4 oz (86.8 kg)   LMP 07/01/2017   SpO2 100%   BMI 29.98 kg/m    Objective:   Physical Exam  Constitutional: She appears well-developed and well-nourished.  Cardiovascular: Normal rate, regular rhythm and normal heart sounds.  No murmur heard. Pulmonary/Chest: Effort normal and breath sounds normal. No respiratory distress. She has no wheezes.  Abdominal: Soft.  Very mild suprapubic tenderness to  palpation. Neg CVAT bilaterally  Psychiatric: She has a normal mood and affect. Her behavior is normal. Judgment and thought content normal.          Assessment & Plan:  UTI- UA is unremarkable. Due to symptoms will begin empiric macrobid and send urine for culture. Pt is advised to call if new/worsening symptoms, fever or if symptoms are not improved in 2-3 days.

## 2017-08-01 LAB — URINE CULTURE
MICRO NUMBER:: 90549297
RESULT: NO GROWTH
SPECIMEN QUALITY:: ADEQUATE

## 2017-08-02 ENCOUNTER — Other Ambulatory Visit: Payer: Self-pay | Admitting: Family

## 2017-08-02 ENCOUNTER — Encounter: Payer: Self-pay | Admitting: Neurology

## 2017-08-02 NOTE — Telephone Encounter (Signed)
Note done, thanks 

## 2017-12-18 ENCOUNTER — Ambulatory Visit (INDEPENDENT_AMBULATORY_CARE_PROVIDER_SITE_OTHER): Payer: Self-pay | Admitting: Neurology

## 2017-12-18 ENCOUNTER — Encounter: Payer: Self-pay | Admitting: Neurology

## 2017-12-18 VITALS — BP 126/76 | HR 78 | Ht 67.0 in | Wt 190.0 lb

## 2017-12-18 DIAGNOSIS — G40009 Localization-related (focal) (partial) idiopathic epilepsy and epileptic syndromes with seizures of localized onset, not intractable, without status epilepticus: Secondary | ICD-10-CM

## 2017-12-18 MED ORDER — TOPIRAMATE ER 25 MG PO CAP24: 75.0000 mg | ORAL_CAPSULE | Freq: Every day | ORAL | 0 refills | Status: DC

## 2017-12-18 MED ORDER — TOPIRAMATE ER 25 MG PO CAP24: ORAL_CAPSULE | ORAL | 11 refills | Status: DC

## 2017-12-18 NOTE — Progress Notes (Signed)
NEUROLOGY FOLLOW UP OFFICE NOTE  Veronica Stanton [id number]  DOB: 01/29/1981  HISTORY OF PRESENT ILLNESS: I had the pleasure of seeing Veronica Stanton in follow-up in the neurology clinic on 12/18/2017.  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 Since her last visit, she reports waking up one night last month and seeing the cursive writing on her wall, similar to prior aura. She then reports that she had self-reduced Topamax to 75mg  qhs due to word-finding and cognitive difficulties affecting her work. She has had more headaches 3 out of 7 days a week, with associated left-sided neck pain. Sometimes she has left-sided chest pain with stress at work. Headaches last half of the day, usually resolving once she gets off work. She is finishing her doctorate in May, it is going well. She denies any episodes of loss of consciousness since April 2016. She denies any dizziness, blurred vision, staring/unresponsiveness, no gaps in time, olfactory/gustatory hallucinations, deja vu, rising epigastric sensation, myoclonic jerks. No falls. She is interested in another trial of phentermine for weight loss.   HPI: This is a very pleasant 37 yo RH woman with no significant past medical who had an episode of loss of consciousness with urinary incontinence last 07/19/14. She reports an evaluation in 2013 for palpitations that started at age 22 where palpitations would wake her up, lasting 5 minutes. She was evaluated by Cardiology at that time, with normal echocardiogram and 48-hour Holter monitor. She denies any further episodes of palpitations in the past 3 years. She also reports episodes that wake her up from night when she would see writing on the wall, described as black cursive writing. These were separate from the palpitations. She has had only one episode of palpitations during wakefulness that occurred while driving 3 years ago. She denies any  visual symptoms during daytime. No associated confusion. On 07/19/14, she recalls waking up and seeing the black cursive writing on the wall. She then has no recollection of events until EMS was there. Her daughter was sleeping in her room and heard her make funny noises, open her eyes and get up to go to the bathroom. She then fell to the floor, no report of convulsive activity. She had urinary incontinence, no tongue bite. When EMS arrived, she reports being aware of her surroundings, and recalls cleaning herself up before going to [facility] ER. In the ER, CBC and BMP were unremarkable. EKG reported sinus tachycardia, nonspecific T wave abnormality. She was discharged home. She denied any alcohol intake, no sleep deprivation.  She reports episodes once a month where she feels a "weird feeling, like you're scared and would dream something." This lasts around 2 seconds and she states she has "learned to control" them. The last episode was in May 2016.   Epilepsy Risk Factors: She had a normal birth and early development. There is no history of febrile convulsions, CNS infections such as meningitis/encephalitis, significant traumatic brain injury, neurosurgical procedures, or family history of seizures.  Diagnostic Data: I personally reviewed her routine EEG which was normal. Her 24-hour EEG was abnormal, with occasional right anterior temporal sharp waves seen exclusively in sleep.  Her MRI brain with and without contrast was normal, hippocampi symmetric with no abnormal signal or enhancement seen.   PAST MEDICAL HISTORY: Past Medical History:  Diagnosis Date  . No diagnosis   . Palpitations     MEDICATIONS: Current Outpatient Medications on File Prior to  Visit  Medication Sig Dispense Refill  . topiramate (TOPAMAX) 25 MG tablet Take 3 tablets twice a day 180 tablet 11   No current facility-administered medications on file prior to visit.     ALLERGIES: No Known Allergies  FAMILY  HISTORY: Family History  Problem Relation Age of Onset  . Stroke Neg Hx   . Heart disease Neg Hx   . Mental illness Neg Hx     SOCIAL HISTORY: Social History   Socioeconomic History  . Marital status: Single    Spouse name: Not on file  . Number of children: Not on file  . Years of education: Not on file  . Highest education level: Not on file  Occupational History  . Not on file  Social Needs  . Financial resource strain: Not on file  . Food insecurity:    Worry: Not on file    Inability: Not on file  . Transportation needs:    Medical: Not on file    Non-medical: Not on file  Tobacco Use  . Smoking status: Never Smoker  . Smokeless tobacco: Never Used  Substance and Sexual Activity  . Alcohol use: No    Alcohol/week: 0.0 standard drinks  . Drug use: No  . Sexual activity: Not on file  Lifestyle  . Physical activity:    Days per week: Not on file    Minutes per session: Not on file  . Stress: Not on file  Relationships  . Social connections:    Talks on phone: Not on file    Gets together: Not on file    Attends religious service: Not on file    Active member of club or organization: Not on file    Attends meetings of clubs or organizations: Not on file    Relationship status: Not on file  . Intimate partner violence:    Fear of current or ex partner: Not on file    Emotionally abused: Not on file    Physically abused: Not on file    Forced sexual activity: Not on file  Other Topics Concern  . Not on file  Social History Narrative  . Not on file    REVIEW OF SYSTEMS: Constitutional: No fevers, chills, or sweats, no generalized fatigue, change in appetite Eyes: No visual changes, double vision, eye pain Ear, nose and throat: No hearing loss, ear pain, nasal congestion, sore throat Cardiovascular: No chest pain, palpitations Respiratory:  No shortness of breath at rest or with exertion, wheezes GastrointestinaI: No nausea, vomiting, diarrhea, abdominal  pain, fecal incontinence Genitourinary:  No dysuria, urinary retention or frequency Musculoskeletal:  + neck pain,no back pain Integumentary: No rash, pruritus, skin lesions Neurological: as above Psychiatric: No depression, insomnia, anxiety Endocrine: No palpitations, fatigue, diaphoresis, mood swings, change in appetite, change in weight, increased thirst Hematologic/Lymphatic:  No anemia, purpura, petechiae. Allergic/Immunologic: no itchy/runny eyes, nasal congestion, recent allergic reactions, rashes  PHYSICAL EXAM: Vitals:   12/18/17 1617  BP: 126/76  Pulse: 78  SpO2: 99%   General: No acute distress Head:  Normocephalic/atraumatic Neck: supple, no paraspinal tenderness, full range of motion Heart:  Regular rate and rhythm Lungs:  Clear to auscultation bilaterally Back: No paraspinal tenderness Skin/Extremities: No rash, no edema Neurological Exam: alert and oriented to person, place, and time. No aphasia or dysarthria. Fund of knowledge is appropriate.  Recent and remote memory are intact.3/3 delayed recall. Attention and concentration are normal.    Able to name objects and repeat phrases. Cranial  nerves: Pupils equal, round, reactive to light.  Extraocular movements intact with no nystagmus. Visual fields full. Facial sensation intact. No facial asymmetry. Tongue, uvula, palate midline.  Motor: Bulk and tone normal, muscle strength 5/5 throughout with no pronator drift.  Sensation to light touch, pin, cold intact.  No extinction to double simultaneous stimulation.  Deep tendon reflexes 2+ throughout, toes downgoing.  Finger to nose testing intact.  Gait narrow-based and steady, able to tandem walk adequately.  Romberg negative.   IMPRESSION: This is a very pleasant 37 yo RH woman with an episode of loss of consciousness last 07/19/14. This was preceded by visual hallucinations of seeing black cursive writing on the wall. She reports these visual symptoms started at age 37, waking  her up from sleep. She had also reported recurrent episodes of a "weird scared feeling" once a month. 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 Her 24-hour EEG had shown occasional right anterior temporal epileptiform discharges exclusively in sleep. No electrographic seizures. MRI brain normal. No further loss of consciousness since April 2016, however she had one episode last month of visual hallucination similar to prior. She had self-reduced Topamax due to cognitive changes. She will try Trokendi XR and see if the extended-release formulation has less side effects. Switch to Trokendi XR 75mg  qhs, we may uptitrate as needed. She will discuss weight loss with her PCP, we had discussed monitoring seizures on phentermine since this is a stimulant. She is aware of [place] driving laws to stop driving after an episode of loss of consciousness, until 6 months event-free. She will follow-up in 3 months and knows to call our office for any problems.   Thank you for allowing me to participate in her care.  Please do not hesitate to call for any questions or concerns.  The duration of this appointment visit was 26 minutes of face-to-face time with the patient.  Greater than 50% of this time was spent in counseling, explanation of diagnosis, planning of further management, and coordination of care.   Veronica Stanton, M.D.   CC: Dr. Zola ButtonLowne Chase

## 2017-12-18 NOTE — Patient Instructions (Addendum)
1. Switch to Trokendi XR 25mg : take 3 tablets every night 2. Follow-up on Dec 18, 2pm, call for any changes  Seizure Precautions: 1. If medication has been prescribed for you to prevent seizures, take it exactly as directed.  Do not stop taking the medicine without talking to your doctor first, even if you have not had a seizure in a long time.   2. Avoid activities in which a seizure would cause danger to yourself or to others.  Don't operate dangerous machinery, swim alone, or climb in high or dangerous places, such as on ladders, roofs, or girders.  Do not drive unless your doctor says you may.  3. If you have any warning that you may have a seizure, lay down in a safe place where you can't hurt yourself.    4.  No driving for 6 months from last seizure, as [place] [place].   Please refer to the following link on the Epile[employer] Foundation of America's website for more information: http://www.epilepsyfoundation.org/answerplace/Social/driving/drivingu.cfm   5.  Maintain good sleep hygiene. Avoid alcohol.  6.  Notify your neurology if you are planning pregnancy or if you become pregnant.  7.  Contact your doctor if you have any problems that may be related to the medicine you are taking.  8.  Call 911 and bring the patient back to the ED if:        A.  The seizure lasts longer than 5 minutes.       B.  The patient doesn't awaken shortly after the seizure  C.  The patient has new problems such as difficulty seeing, speaking or moving  D.  The patient was injured during the seizure  E.  The patient has a temperature over 102 F (39C)  F.  The patient vomited and now is having trouble breathing

## 2018-02-16 ENCOUNTER — Encounter: Payer: Self-pay | Admitting: Family Medicine

## 2018-02-16 ENCOUNTER — Ambulatory Visit: Payer: Self-pay | Admitting: *Deleted

## 2018-02-16 ENCOUNTER — Ambulatory Visit: Payer: No Typology Code available for payment source | Admitting: Family Medicine

## 2018-02-16 VITALS — BP 100/80 | HR 62 | Temp 98.0°F | Wt 188.0 lb

## 2018-02-16 DIAGNOSIS — R404 Transient alteration of awareness: Secondary | ICD-10-CM | POA: Insufficient documentation

## 2018-02-16 LAB — COMPREHENSIVE METABOLIC PANEL
ALK PHOS: 40 U/L (ref 39–117)
ALT: 11 U/L (ref 0–35)
AST: 12 U/L (ref 0–37)
Albumin: 4 g/dL (ref 3.5–5.2)
BILIRUBIN TOTAL: 0.3 mg/dL (ref 0.2–1.2)
BUN: 10 mg/dL (ref 6–23)
CO2: 25 meq/L (ref 19–32)
Calcium: 9 mg/dL (ref 8.4–10.5)
Chloride: 109 mEq/L (ref 96–112)
Creatinine, Ser: 0.92 mg/dL (ref 0.40–1.20)
GFR: 87.18 mL/min (ref >60.00)
GLUCOSE: 98 mg/dL (ref 70–99)
Potassium: 3.6 mEq/L (ref 3.5–5.1)
SODIUM: 139 meq/L (ref 135–145)
TOTAL PROTEIN: 7.3 g/dL (ref 6.0–8.3)

## 2018-02-16 LAB — CBC
HCT: 37.9 % (ref 36.0–46.0)
HEMOGLOBIN: 12.7 g/dL (ref 12.0–15.0)
MCHC: 33.5 g/dL (ref 30.0–36.0)
MCV: 92.5 fl (ref 78.0–100.0)
Platelets: 230 10*3/uL (ref 150.0–400.0)
RBC: 4.1 Mil/uL (ref 3.87–5.11)
RDW: 14.2 % (ref 11.5–15.5)
WBC: 6.3 10*3/uL (ref 4.0–10.5)

## 2018-02-16 LAB — TSH: TSH: 0.8 u[IU]/mL (ref 0.35–4.50)

## 2018-02-16 NOTE — Patient Instructions (Signed)
We'll be in touch with lab results Please let us know if this occurs again

## 2018-02-16 NOTE — Telephone Encounter (Signed)
Pt. Reports that on 02/14/18 she was at work. She was next to a heater and became dizzy and thought she was going to faint. Felt like "my heart was racing."  "It took me 15-20 minutes to recover." No other episodes since then. Requests to be seen today to be "sure everything is ok." Needs an appointment before 3 p.m. Due to another appointment. No appointments available at [facility] in that time frame.  Reason for Disposition . [1] MODERATE dizziness (e.g., interferes with normal activities) AND [2] has NOT been evaluated by physician for this  (Exception: dizziness caused by heat exposure, sudden standing, or poor fluid intake)  Answer Assessment - Initial Assessment Questions 1. DESCRIPTION: "Describe your dizziness."     Felt faint  02/14/18 2. LIGHTHEADED: "Do you feel lightheaded?" (e.g., somewhat faint, woozy, weak upon standing)     Felt like she was going to faint 3. VERTIGO: "Do you feel like either you or the room is spinning or tilting?" (i.e. vertigo)     No 4. SEVERITY: "How bad is it?"  "Do you feel like you are going to faint?" "Can you stand and walk?"   - MILD - walking normally   - MODERATE - interferes with normal activities (e.g., work, school)    - SEVERE - unable to stand, requires support to walk, feels like passing out now.      No 5. ONSET:  "When did the dizziness begin?"      02/14/18 6. AGGRAVATING FACTORS: "Does anything make it worse?" (e.g., standing, change in head position)     No dizziness now 7. HEART RATE: "Can you tell me your heart rate?" "How many beats in 15 seconds?"  (Note: not all patients can do this)       Heart felt like it was racing 8. CAUSE: "What do you think is causing the dizziness?"     May have been over-heated 9. RECURRENT SYMPTOM: "Have you had dizziness before?" If so, ask: "When was the last time?" "What happened that time?"     No 10. OTHER SYMPTOMS: "Do you have any other symptoms?" (e.g., fever, chest pain, vomiting, diarrhea,  bleeding)       Has had some anxiety 11. PREGNANCY: "Is there any chance you are pregnant?" "When was your last menstrual period?"       No  Protocols used: DIZZINESS [facility]- LIGHTHEADEDNESS-A-AH

## 2018-02-16 NOTE — Telephone Encounter (Signed)
Message from Darron DoomKaren A Davis sent at 02/16/2018 10:04 AM EST   Patient called in was trying to get an appointment to see Dr Zola ButtonLowne Chase. Stated that on 02/14/18 while at work she had a dizzy spell that made her feel as if she was about to faint said her heart also started racing. She is requesting a call back to discuss why this possible happened. Ph# [phone]

## 2018-02-16 NOTE — Progress Notes (Signed)
Veronica Islamiffany T Hewitt - 37 y.o. female MRN [id number]  Date of birth: 1980-07-16  Subjective Chief Complaint  Patient presents with  . Dizziness    HPI Veronica Stanton is a 37 y.o. female with history of anxiety, palpitations and seizure disorder here today after experiencing transient altered level of consciousness.  She reports that she was at work 2 days ago where she was sitting in her office and had her space heater running.  She was going to turn off space heater as she felt she was getting too hot because it did not shut off properly.   When going to turn off space heater she states that  everything went "blank".   She is unsure how long this lasted but once this returned to normal she was able to cut off her space heater.  She reports that this event scared her and she felt like her heart was racing because of this.  She denies any feeling of dizziness or nausea.  She has not had associated headaches, fevers or chills.  She is still followed by neurology for prior history of seizures however she states that this did not feel like typical aura that accompanies her seizures.  She has no further symptoms since this occurred on 11/20.  She does also report being very stressed at her job and she mentions needing to take legal action to protect her job.  She also mentions that she doesn't want to think that her space heater had been tampered with but thinks this could be a cause of it not shutting off properly.     ROS:  A comprehensive ROS was completed and negative except as noted per HPI  No Known Allergies  Past Medical History:  Diagnosis Date  . No diagnosis   . Palpitations     Past Surgical History:  Procedure Laterality Date  . negative      Social History   Socioeconomic History  . Marital status: Single    Spouse name: Not on file  . Number of children: Not on file  . Years of education: Not on file  . Highest education level: Not on file  Occupational History  . Not on  file  Social Needs  . Financial resource strain: Not on file  . Food insecurity:    Worry: Not on file    Inability: Not on file  . Transportation needs:    Medical: Not on file    Non-medical: Not on file  Tobacco Use  . Smoking status: Never Smoker  . Smokeless tobacco: Never Used  Substance and Sexual Activity  . Alcohol use: No    Alcohol/week: 0.0 standard drinks  . Drug use: No  . Sexual activity: Not on file  Lifestyle  . Physical activity:    Days per week: Not on file    Minutes per session: Not on file  . Stress: Not on file  Relationships  . Social connections:    Talks on phone: Not on file    Gets together: Not on file    Attends religious service: Not on file    Active member of club or organization: Not on file    Attends meetings of clubs or organizations: Not on file    Relationship status: Not on file  Other Topics Concern  . Not on file  Social History Narrative  . Not on file    Family History  Problem Relation Age of Onset  . Stroke Neg Hx   .  Heart disease Neg Hx   . Mental illness Neg Hx     Health Maintenance  Topic Date Due  . HIV Screening  08/21/1993  . TETANUS/TDAP  08/21/1997  . PAP SMEAR  08/22/1999  . INFLUENZA VACCINE  10/26/2017    ----------------------------------------------------------------------------------------------------------------------------------------------------------------------------------------------------------------- Physical Exam BP 100/80   Pulse 62   Temp 98 F (36.7 C) (Oral)   Wt 188 lb (85.3 kg)   SpO2 98%   BMI 29.44 kg/m   Physical Exam  Constitutional: She is oriented to person, place, and time. She appears well-nourished. No distress.  HENT:  Head: Normocephalic and atraumatic.  Mouth/Throat: Oropharynx is clear and moist.  Eyes: No scleral icterus.  Neck: Neck supple. No thyromegaly present.  Cardiovascular: Normal rate, regular rhythm and normal heart sounds.  Pulmonary/Chest:  Breath sounds normal.  Musculoskeletal: Normal range of motion.  Neurological: She is alert and oriented to person, place, and time. She displays normal reflexes. No cranial nerve deficit or sensory deficit. She displays a negative Romberg sign. Coordination and gait (normal gait and tandem gait) normal.  Skin: Skin is warm and dry.  Psychiatric: She has a normal mood and affect. Her behavior is normal.    ------------------------------------------------------------------------------------------------------------------------------------------------------------------------------------------------------------------- Assessment and Plan  Awareness alteration, transient -Unclear etiology, has history of seizure disorder however reports that this feels different from prior seizures -Check labs today (cmp, cbc and TSH) -? Increased stress contributing as she mentions need for FMLA paperwork due to job stress.  Discussed that she should f/u with PCP to discuss having this completed.

## 2018-02-16 NOTE — Assessment & Plan Note (Signed)
-  Unclear etiology, has history of seizure disorder however reports that this feels different from prior seizures -Check labs today (cmp, cbc and TSH) -? Increased stress contributing as she mentions need for FMLA paperwork due to job stress.  Discussed that she should f/u with PCP to discuss having this completed.

## 2018-03-13 ENCOUNTER — Other Ambulatory Visit: Payer: Self-pay

## 2018-03-14 ENCOUNTER — Ambulatory Visit (INDEPENDENT_AMBULATORY_CARE_PROVIDER_SITE_OTHER): Payer: No Typology Code available for payment source | Admitting: Neurology

## 2018-03-14 ENCOUNTER — Other Ambulatory Visit: Payer: Self-pay

## 2018-03-14 VITALS — BP 110/74 | HR 99 | Ht 67.0 in | Wt 170.0 lb

## 2018-03-14 DIAGNOSIS — G40009 Localization-related (focal) (partial) idiopathic epilepsy and epileptic syndromes with seizures of localized onset, not intractable, without status epilepticus: Secondary | ICD-10-CM

## 2018-03-14 MED ORDER — TOPIRAMATE ER 25 MG PO CAP24: 1.0000 | ORAL_CAPSULE | Freq: Every day | ORAL | 0 refills | Status: DC

## 2018-03-14 MED ORDER — TOPIRAMATE ER 50 MG PO CAP24: 1.0000 | ORAL_CAPSULE | Freq: Every day | ORAL | 0 refills | Status: DC

## 2018-03-14 MED ORDER — TOPIRAMATE 25 MG PO TABS: ORAL_TABLET | ORAL | 3 refills | Status: DC

## 2018-03-14 NOTE — Patient Instructions (Addendum)
1. Take Trokendi XR 75mg  daily. You will have samples of the 25mg  dose and 50mg  dose, take 1 tablet of 25mg  and 1 tablet of 50mg  daily (total of 75mg  daily)  2. Once December insurance is settled, get the 90-day prescription at your pharmacy for Topamax 25mg : take 3 tablets once a day  3. Follow-up in 3 months, call for any changes.  Good luck and congratulations!  Seizure Precautions: 1. If medication has been prescribed for you to prevent seizures, take it exactly as directed.  Do not stop taking the medicine without talking to your doctor first, even if you have not had a seizure in a long time.   2. Avoid activities in which a seizure would cause danger to yourself or to others.  Don't operate dangerous machinery, swim alone, or climb in high or dangerous places, such as on ladders, roofs, or girders.  Do not drive unless your doctor says you may.  3. If you have any warning that you may have a seizure, lay down in a safe place where you can't hurt yourself.    4.  No driving for 6 months from last seizure, as per [facility] [place].   Please refer to the following link on the Epilepsy Foundation of America's website for more information: http://www.epilepsyfoundation.org/answerplace/Social/driving/drivingu.cfm   5.  Maintain good sleep hygiene. Avoid alcohol.  6.  Notify your neurology if you are planning pregnancy or if you become pregnant.  7.  Contact your doctor if you have any problems that may be related to the medicine you are taking.  8.  Call 911 and bring the patient back to the ED if:        A.  The seizure lasts longer than 5 minutes.       B.  The patient doesn't awaken shortly after the seizure  C.  The patient has new problems such as difficulty seeing, speaking or moving  D.  The patient was injured during the seizure  E.  The patient has a temperature over 102 F (39C)  F.  The patient vomited and now is having trouble breathing

## 2018-03-14 NOTE — Progress Notes (Signed)
NEUROLOGY FOLLOW UP OFFICE NOTE  Veronica Stanton [id number]  DOB: 31-Mar-1980  HISTORY OF PRESENT ILLNESS: I had the pleasure of seeing Veronica Stanton Coover in follow-up in the neurology clinic on 03/14/2018.  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 On her last visit, she reported one episode in August when she woke up seeing cursive writing on the wall, similar to her prior aura. At that time she had self-reduced Topamax to 75mg  qhs due to word-finding difficulties. She was also reporting frequent headaches occurring 3 times a week with left-sided neck pain. She was switched to Trokendi XR 75mg  qhs and noticed a definite improvement in word-finding difficulties, however it was cost-prohibitive and she went back to taking the Topamax. She denies any further visual hallucinations. She is happy to report that she has resigned from her job where she was having a lot of stress. She has some concerns that they were trying to make it harder for her, one time she felt herself zoning out and thought it was something they put in the heater. For a split second she felt like she would pass out. She went to see her doctor and had bloodwork which was normal. She did not use her heater the next day and did not have any issues. She thought about it during Thanksgiving break and gave in her resignation. She has not had any chest pains or headaches since then.    HPI: This is a very pleasant 37 yo RH woman with no significant past medical who had an episode of loss of consciousness with urinary incontinence last 07/19/14. She reports an evaluation in 2013 for palpitations that started at age [age] where palpitations would wake her up, lasting 5 minutes. She was evaluated by Cardiology at that time, with normal echocardiogram and 48-hour Holter monitor. She denies any further episodes of palpitations in the past 3 years. She also reports episodes that wake her up  from night when she would see writing on the wall, described as black cursive writing. These were separate from the palpitations. She has had only one episode of palpitations during wakefulness that occurred while driving 3 years ago. She denies any visual symptoms during daytime. No associated confusion. On 07/19/14, she recalls waking up and seeing the black cursive writing on the wall. She then has no recollection of events until EMS was there. Her daughter was sleeping in her room and heard her make funny noises, open her eyes and get up to go to the bathroom. She then fell to the floor, no report of convulsive activity. She had urinary incontinence, no tongue bite. When EMS arrived, she reports being aware of her surroundings, and recalls cleaning herself up before [facility]. In the ER, CBC and BMP were unremarkable. EKG reported sinus tachycardia, nonspecific T wave abnormality. She was discharged home. She denied any alcohol intake, no sleep deprivation.  She reports episodes once a month where she feels a "weird feeling, like you're scared and would dream something." This lasts around 2 seconds and she states she has "learned to control" them. The last episode was in May 2016.   Epilepsy Risk Factors: She had a normal birth and early development. There is no history of febrile convulsions, CNS infections such as meningitis/encephalitis, significant traumatic brain injury, neurosurgical procedures, or family history of seizures.  Diagnostic Data: I personally reviewed her routine EEG which was normal. Her 24-hour EEG was  abnormal, with occasional right anterior temporal sharp waves seen exclusively in sleep.  Her MRI brain with and without contrast was normal, hippocampi symmetric with no abnormal signal or enhancement seen.   PAST MEDICAL HISTORY: Past Medical History:  Diagnosis Date  . No diagnosis   . Palpitations     MEDICATIONS: Current Outpatient Medications on File Prior to  Visit  Medication Sig Dispense Refill  . topiramate (TOPAMAX) 25 MG tablet     . Topiramate ER (TROKENDI XR) 25 MG CP24 Take 75 mg by mouth at bedtime. 21 capsule 0  . Topiramate ER (TROKENDI XR) 25 MG CP24 Take 3 caps every night 90 capsule 11   No current facility-administered medications on file prior to visit.     ALLERGIES: No Known Allergies  FAMILY HISTORY: Family History  Problem Relation Age of Onset  . Stroke Neg Hx   . Heart disease Neg Hx   . Mental illness Neg Hx     SOCIAL HISTORY: Social History   Socioeconomic History  . Marital status: Single    Spouse name: Not on file  . Number of children: Not on file  . Years of education: Not on file  . Highest education level: Not on file  Occupational History  . Not on file  Social Needs  . Financial resource strain: Not on file  . Food insecurity:    Worry: Not on file    Inability: Not on file  . Transportation needs:    Medical: Not on file    Non-medical: Not on file  Tobacco Use  . Smoking status: Never Smoker  . Smokeless tobacco: Never Used  Substance and Sexual Activity  . Alcohol use: No    Alcohol/week: 0.0 standard drinks  . Drug use: No  . Sexual activity: Not on file  Lifestyle  . Physical activity:    Days per week: Not on file    Minutes per session: Not on file  . Stress: Not on file  Relationships  . Social connections:    Talks on phone: Not on file    Gets together: Not on file    Attends religious service: Not on file    Active member of club or organization: Not on file    Attends meetings of clubs or organizations: Not on file    Relationship status: Not on file  . Intimate partner violence:    Fear of current or ex partner: Not on file    Emotionally abused: Not on file    Physically abused: Not on file    Forced sexual activity: Not on file  Other Topics Concern  . Not on file  Social History Narrative  . Not on file    REVIEW OF SYSTEMS: Constitutional: No fevers,  chills, or sweats, no generalized fatigue, change in appetite Eyes: No visual changes, double vision, eye pain Ear, nose and throat: No hearing loss, ear pain, nasal congestion, sore throat Cardiovascular: No chest pain, palpitations Respiratory:  No shortness of breath at rest or with exertion, wheezes GastrointestinaI: No nausea, vomiting, diarrhea, abdominal pain, fecal incontinence Genitourinary:  No dysuria, urinary retention or frequency Musculoskeletal:  + neck pain,no back pain Integumentary: No rash, pruritus, skin lesions Neurological: as above Psychiatric: No depression, insomnia, anxiety Endocrine: No palpitations, fatigue, diaphoresis, mood swings, change in appetite, change in weight, increased thirst Hematologic/Lymphatic:  No anemia, purpura, petechiae. Allergic/Immunologic: no itchy/runny eyes, nasal congestion, recent allergic reactions, rashes  PHYSICAL EXAM: Vitals:   03/14/18 1426  BP: 110/74  Pulse: 99  SpO2: 98%   General: No acute distress Head:  Normocephalic/atraumatic Neck: supple, no paraspinal tenderness, full range of motion Heart:  Regular rate and rhythm Lungs:  Clear to auscultation bilaterally Back: No paraspinal tenderness Skin/Extremities: No rash, no edema Neurological Exam: alert and oriented to person, place, and time. No aphasia or dysarthria. Fund of knowledge is appropriate.  Recent and remote memory are intact. Attention and concentration are normal.    Able to name objects and repeat phrases. Cranial nerves: Pupils equal, round, reactive to light.  Extraocular movements intact with no nystagmus. Visual fields full. Facial sensation intact. No facial asymmetry. Tongue, uvula, palate midline.  Motor: Bulk and tone normal, muscle strength 5/5 throughout with no pronator drift.  Sensation to light touch, pin, cold intact.  No extinction to double simultaneous stimulation.  Deep tendon reflexes 2+ throughout, toes downgoing.  Finger to nose testing  intact.  Gait narrow-based and steady, able to tandem walk adequately.  Romberg negative.   IMPRESSION: This is a very pleasant 37 yo RH woman with an episode of loss of consciousness last 07/19/14. This was preceded by visual hallucinations of seeing black cursive writing on the wall. She reports these visual symptoms started at age 66, waking her up from sleep. She had also reported recurrent episodes of a "weird scared feeling" once a month. 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 Her 24-hour EEG had shown occasional right anterior temporal epileptiform discharges exclusively in sleep. No electrographic seizures. MRI brain normal. No further loss of consciousness since April 2016, no further episodes of visual hallucination since August (when she self-reduced Topamax). Trokendi was cost-prohibitive. She is undergoing job/insurance changes currently, she was given samples for Trokendi XR 75mg  qhs and refills for Topiramate 75mg  qhs were sent to her pharmacy, however if seizures recur on low dose, we agreed to resume Topiramate 150mg  BID. She is aware of [place] driving laws to stop driving after an episode of loss of consciousness, until 6 months event-free. She will follow-up in 3 months and knows to call our office for any problems.   Thank you for allowing me to participate in her care.  Please do not hesitate to call for any questions or concerns.  The duration of this appointment visit was 20 minutes of face-to-face time with the patient.  Greater than 50% of this time was spent in counseling, explanation of diagnosis, planning of further management, and coordination of care.   Patrcia Dolly, M.D.   CC: Dr. Zola Button

## 2018-03-16 ENCOUNTER — Encounter: Payer: Self-pay | Admitting: Neurology

## 2018-05-16 ENCOUNTER — Encounter: Payer: Self-pay | Admitting: Neurology

## 2018-06-18 ENCOUNTER — Telehealth: Payer: Self-pay

## 2018-06-18 NOTE — Telephone Encounter (Signed)
LMOM advising that we are not currently seeing pt's in office due to the COVID-19 pandemic.  Asked for return call to verify pt's email and telephone number to set up Webex appt.

## 2018-06-20 ENCOUNTER — Ambulatory Visit: Payer: No Typology Code available for payment source | Admitting: Neurology

## 2018-06-21 ENCOUNTER — Ambulatory Visit: Payer: No Typology Code available for payment source | Admitting: Neurology

## 2018-07-02 ENCOUNTER — Encounter: Payer: Self-pay | Admitting: Neurology

## 2018-11-09 ENCOUNTER — Other Ambulatory Visit: Payer: Self-pay

## 2018-11-09 ENCOUNTER — Emergency Department (HOSPITAL_BASED_OUTPATIENT_CLINIC_OR_DEPARTMENT_OTHER)
Admission: EM | Admit: 2018-11-09 | Discharge: 2018-11-09 | Disposition: A | Discharge status: Home / Self Care | Payer: No Typology Code available for payment source | Attending: Emergency Medicine | Admitting: Emergency Medicine

## 2018-11-09 ENCOUNTER — Encounter (HOSPITAL_BASED_OUTPATIENT_CLINIC_OR_DEPARTMENT_OTHER): Payer: Self-pay

## 2018-11-09 DIAGNOSIS — K047 Periapical abscess without sinus: Secondary | ICD-10-CM

## 2018-11-09 DIAGNOSIS — K0889 Other specified disorders of teeth and supporting structures: Secondary | ICD-10-CM | POA: Diagnosis present

## 2018-11-09 MED ORDER — HYDROCODONE-ACETAMINOPHEN 5-325 MG PO TABS: 1.0000 | ORAL_TABLET | Freq: Every evening | ORAL | 0 refills | Status: DC | PRN

## 2018-11-09 MED ORDER — IBUPROFEN 600 MG PO TABS: 600.0000 mg | ORAL_TABLET | Freq: Four times a day (QID) | ORAL | 0 refills | Status: DC | PRN

## 2018-11-09 MED ORDER — PENICILLIN V POTASSIUM 500 MG PO TABS: 500.0000 mg | ORAL_TABLET | Freq: Four times a day (QID) | ORAL | 0 refills | Status: AC

## 2018-11-09 NOTE — ED Notes (Signed)
ED Provider at bedside. 

## 2018-11-09 NOTE — Discharge Instructions (Signed)
You have a dental infection. Call your dentist today or Monday to schedule a follow up appointment.  Ibuprofen as needed for pain. As we discussed, I did give you #3 pain pills to take only at night for severe, uncontrolled pain. Take your full course of antibiotics. Return to ER for fever, worsening facial swelling, new or worsening symptoms, any additional concerns.

## 2018-11-09 NOTE — ED Provider Notes (Signed)
[facility] EMERGENCY DEPARTMENT Provider Note   CSN: [id number] Arrival date & time: 11/09/18  1324     History   Chief Complaint Chief Complaint  Patient presents with  . Dental Pain    HPI Veronica Stanton is a 38 y.o. female.     The history is provided by medical records. No language interpreter was used.  Dental Pain Associated symptoms: no facial swelling and no fever     Veronica Stanton is a 38 y.o. female who presents to the Emergency Department complaining of persistent, gradually worsening, left-sided, upper dental pain beginning 2 days ago.  Initially, patient felt as if she had a toothache and may be something was stuck in her tooth that irritated it.  She brushed several times extra that day and floss to couple of times as well.  Tooth ache persisted.  She started having a radiation of pain to her left ear.  Last night, pain began throbbing and was hurting her so badly she could not sleep.  She tried taking 400 mg of ibuprofen with very minimal improvement in her symptoms.  She did call her dentist today to try to schedule an appointment, but they are still closed due to coronavirus.  She called another dentist that she had never been to before just to see if she can get an appointment.  She left a message around 10 AM, but never heard anything back from them, prompting her to come to the emergency department.  Patient does state that she gets routine dental care and has cleanings typically every 6 months.  Pt denies facial swelling, fever, chills, difficulty breathing, difficulty swallowing.   Past Medical History:  Diagnosis Date  . No diagnosis   . Palpitations     Patient Active Problem List   Diagnosis Date Noted  . Altered level of consciousness 02/16/2018  . Left arm weakness 06/08/2016  . Localization-related idiopathic epilepsy and epileptic syndromes with seizures of localized onset, not intractable, without status epilepticus (HCC) 09/02/2014   . Awareness alteration, transient 07/30/2014  . Faintness 07/30/2014  . Syncope 07/28/2014  . Rapid palpitations 11/16/2011  . Dyspnea 11/16/2011    Past Surgical History:  Procedure Laterality Date  . negative       OB History   No obstetric history on file.      Home Medications    Prior to Admission medications   Medication Sig Start Date End Date Taking? Authorizing Provider  HYDROcodone-acetaminophen (NORCO/VICODIN) 5-325 MG tablet Take 1 tablet by mouth at bedtime as needed for severe pain. 11/09/18   Zacherie Honeyman, Chase PicketJaime Pilcher, PA-C  ibuprofen (ADVIL) 600 MG tablet Take 1 tablet (600 mg total) by mouth every 6 (six) hours as needed. 11/09/18   Karessa Onorato, Chase PicketJaime Pilcher, PA-C  penicillin v potassium (VEETID) 500 MG tablet Take 1 tablet (500 mg total) by mouth 4 (four) times daily for 7 days. 11/09/18 11/16/18  Ziyan Schoon, Chase PicketJaime Pilcher, PA-C  topiramate (TOPAMAX) 25 MG tablet Take 3 tablets daily 03/14/18   Van ClinesAquino, Karen M, MD  Topiramate ER (TROKENDI XR) 25 MG CP24 Take 75 mg by mouth at bedtime. 12/18/17   Van ClinesAquino, Karen M, MD  Topiramate ER (TROKENDI XR) 25 MG CP24 Take 3 caps every night 12/18/17   Van ClinesAquino, Karen M, MD  Topiramate ER (TROKENDI XR) 25 MG CP24 Take 1 tablet by mouth daily. 03/14/18   Van ClinesAquino, Karen M, MD  Topiramate ER (TROKENDI XR) 50 MG CP24 Take 1 tablet by mouth daily.  03/14/18   Cameron Sprang, MD    Family History Family History  Problem Relation Age of Onset  . Stroke Neg Hx   . Heart disease Neg Hx   . Mental illness Neg Hx     Social History Social History   Tobacco Use  . Smoking status: Never Smoker  . Smokeless tobacco: Never Used  Substance Use Topics  . Alcohol use: No  . Drug use: No     Allergies   Patient has no known allergies.   Review of Systems Review of Systems  Constitutional: Negative for chills and fever.  HENT: Positive for dental problem. Negative for facial swelling, sore throat and trouble swallowing.   Respiratory: Negative for  cough and shortness of breath.      Physical Exam Updated Vital Signs BP 118/87 (BP Location: Left Arm)   Pulse 80   Temp 97.6 F (36.4 C) (Oral)   Resp 18   Ht 5\' 7"  (1.702 m)   Wt 83.9 kg   LMP 11/02/2018   SpO2 100%   BMI 28.98 kg/m   Physical Exam Vitals signs and nursing note reviewed.  Constitutional:      General: She is not in acute distress.    Appearance: She is well-developed.  HENT:     Head: Normocephalic and atraumatic.     Mouth/Throat:      Comments: Very good dentition overall. Pain along tooth and associated gumline as depicted in image. No abscess noted. Midline uvula. No trismus. OP moist and clear. No oropharyngeal erythema or edema. Neck supple with no tenderness. No facial edema. Neck:     Musculoskeletal: Neck supple.  Cardiovascular:     Rate and Rhythm: Normal rate and regular rhythm.     Heart sounds: Normal heart sounds. No murmur.  Pulmonary:     Effort: Pulmonary effort is normal. No respiratory distress.     Breath sounds: Normal breath sounds. No wheezing or rales.  Musculoskeletal: Normal range of motion.  Skin:    General: Skin is warm and dry.  Neurological:     Mental Status: She is alert.      ED Treatments / Results  Labs (all labs ordered are listed, but only abnormal results are displayed) Labs Reviewed - No data to display  EKG None  Radiology No results found.  Procedures Procedures (including critical care time)  Medications Ordered in ED Medications - No data to display   Initial Impression / Assessment and Plan / ED Course  I have reviewed the triage vital signs and the nursing notes.  Pertinent labs & imaging results that were available during my care of the patient were reviewed by me and considered in my medical decision making (see chart for details).       NEISHA HINGER is a pleasant 38 y.o. female who presents to ED for dental pain. No abscess requiring immediate incision and drainage. Patient  is afebrile, non toxic appearing, and swallowing secretions well. Exam not concerning for Ludwig's angina or pharyngeal abscess. She is followed closely by dentistry and receives routine cleaning typically every 6 months. She did call her dentist but they are still closed due to coronavirus. After discussing with her, it does sound as if they had a number to call for dental emergencies, but she did not think this would classify as an emergency and didn't want to bother the dental clinic. I do feel she needs a urgent appointment with dentistry and discussed this with  her. Will treat with PenVK and ibuprofen. Pain worsens at night and keeping her up all night. PMP database reviewed with no prescriptions. #3 norco prescribed QHS PRN severe pain to help get her through the weekend until she can call the dentist Monday am. Patient understands and agrees with plan as dictated above. Return precautions discussed. All questions answered.   Patient voices understanding and is agreeable to plan.   Final Clinical Impressions(s) / ED Diagnoses   Final diagnoses:  Dental infection    ED Discharge Orders         Ordered    ibuprofen (ADVIL) 600 MG tablet  Every 6 hours PRN     11/09/18 1410    penicillin v potassium (VEETID) 500 MG tablet  4 times daily     11/09/18 1410    HYDROcodone-acetaminophen (NORCO/VICODIN) 5-325 MG tablet  At bedtime PRN     11/09/18 1410           Madeleyn Schwimmer, Chase PicketJaime Pilcher, PA-C 11/09/18 1419    Maia PlanLong, Joshua G, MD 11/09/18 2007

## 2018-11-09 NOTE — ED Triage Notes (Signed)
Pt c/o left upper toothache x 2 days-NAD-steady gait

## 2019-01-03 ENCOUNTER — Encounter: Payer: Self-pay | Admitting: Family Medicine

## 2019-01-03 ENCOUNTER — Other Ambulatory Visit: Payer: Self-pay

## 2019-01-03 ENCOUNTER — Ambulatory Visit (INDEPENDENT_AMBULATORY_CARE_PROVIDER_SITE_OTHER): Payer: BC Managed Care – PPO | Admitting: Family Medicine

## 2019-01-03 DIAGNOSIS — Z87898 Personal history of other specified conditions: Secondary | ICD-10-CM | POA: Insufficient documentation

## 2019-01-03 DIAGNOSIS — R002 Palpitations: Secondary | ICD-10-CM | POA: Diagnosis not present

## 2019-01-03 NOTE — Assessment & Plan Note (Signed)
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

## 2019-01-03 NOTE — Progress Notes (Signed)
Patient ID: KEANU FRICKEY, female    DOB: May 19, 1980  Age: 38 y.o. MRN: [id number]    Subjective:  Subjective  HPI Veronica Stanton presents asking to have a form filled out so that she can work from home  She has not had headaches, seizures or palpitations in years but she is concerned about stress bringing all back.  No other complaints.    Review of Systems  Constitutional: Negative for appetite change, diaphoresis, fatigue and unexpected weight change.  Eyes: Negative for pain, redness and visual disturbance.  Respiratory: Negative for cough, chest tightness, shortness of breath and wheezing.   Cardiovascular: Negative for chest pain, palpitations and leg swelling.  Endocrine: Negative for cold intolerance, heat intolerance, polydipsia, polyphagia and polyuria.  Genitourinary: Negative for difficulty urinating, dysuria and frequency.  Neurological: Negative for dizziness, light-headedness, numbness and headaches.    History Past Medical History:  Diagnosis Date  . No diagnosis   . Palpitations     She has a past surgical history that includes negative.   Her family history is not on file.She reports that she has never smoked. She has never used smokeless tobacco. She reports that she does not drink alcohol or use drugs.  Current Outpatient Medications on File Prior to Visit  Medication Sig Dispense Refill  . HYDROcodone-acetaminophen (NORCO/VICODIN) 5-325 MG tablet Take 1 tablet by mouth at bedtime as needed for severe pain. 3 tablet 0  . ibuprofen (ADVIL) 600 MG tablet Take 1 tablet (600 mg total) by mouth every 6 (six) hours as needed. 30 tablet 0  . topiramate (TOPAMAX) 25 MG tablet Take 3 tablets daily 270 tablet 3  . Topiramate ER (TROKENDI XR) 25 MG CP24 Take 75 mg by mouth at bedtime. 21 capsule 0  . Topiramate ER (TROKENDI XR) 25 MG CP24 Take 3 caps every night 90 capsule 11  . Topiramate ER (TROKENDI XR) 25 MG CP24 Take 1 tablet by mouth daily. 14 capsule 0  .  Topiramate ER (TROKENDI XR) 50 MG CP24 Take 1 tablet by mouth daily. (Patient not taking: Reported on 01/03/2019) 14 capsule 0   No current facility-administered medications on file prior to visit.      Objective:  Objective  Physical Exam Vitals signs and nursing note reviewed.  Constitutional:      Appearance: She is well-developed.  HENT:     Head: Normocephalic and atraumatic.  Eyes:     Conjunctiva/sclera: Conjunctivae normal.  Neck:     Musculoskeletal: Normal range of motion and neck supple.     Thyroid: No thyromegaly.     Vascular: No carotid bruit or JVD.  Cardiovascular:     Rate and Rhythm: Normal rate and regular rhythm.     Heart sounds: Normal heart sounds. No murmur.  Pulmonary:     Effort: Pulmonary effort is normal. No respiratory distress.     Breath sounds: Normal breath sounds. No wheezing or rales.  Chest:     Chest wall: No tenderness.  Neurological:     Mental Status: She is alert and oriented to person, place, and time.    BP 110/70 (BP Location: Right Arm, Patient Position: Sitting, Cuff Size: Normal)   Pulse (!) 104   Temp (!) 97.5 F (36.4 C) (Temporal)   Resp 18   Ht 5\' 7"  (1.702 m)   Wt 189 lb (85.7 kg)   SpO2 100%   BMI 29.60 kg/m  Wt Readings from Last 3 Encounters:  01/03/19 189 lb (85.7  kg)  11/09/18 185 lb (83.9 kg)  03/14/18 170 lb (77.1 kg)     Lab Results  Component Value Date   WBC 6.3 02/16/2018   HGB 12.7 02/16/2018   HCT 37.9 02/16/2018   PLT 230.0 02/16/2018   GLUCOSE 98 02/16/2018   ALT 11 02/16/2018   AST 12 02/16/2018   NA 139 02/16/2018   K 3.6 02/16/2018   CL 109 02/16/2018   CREATININE 0.92 02/16/2018   BUN 10 02/16/2018   CO2 25 02/16/2018   TSH 0.80 02/16/2018    No results found.   Assessment & Plan:  Plan  I am having Avrie T. Coven maintain her Topiramate ER, Topiramate ER, topiramate, Topiramate ER, Topiramate ER, ibuprofen, and HYDROcodone-acetaminophen.  No orders of the defined types  were placed in this encounter.   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  Donato Schultz, DO

## 2019-01-03 NOTE — Patient Instructions (Signed)
COVID-19 Frequently Asked Questions °COVID-19 (coronavirus disease) is an infection that is caused by a large family of viruses. Some viruses cause illness in people and others cause illness in animals like camels, cats, and bats. In some cases, the viruses that cause illness in animals can spread to humans. °Where did the coronavirus come from? °In December 2019, [place] told the World Health Organization (WHO) of several cases of lung disease (human respiratory illness). These cases were linked to an open seafood and livestock market in the city of [place]. The link to the seafood and livestock market suggests that the virus may have spread from animals to humans. However, since that first outbreak in December, the virus has also been shown to spread from person to person. °What is the name of the disease and the virus? °Disease name °Early on, this disease was called novel coronavirus. This is because scientists determined that the disease was caused by a new (novel) respiratory virus. The World Health Organization (WHO) has now named the disease COVID-19, or coronavirus disease. °Virus name °The virus that causes the disease is called severe acute respiratory syndrome coronavirus 2 (SARS-CoV-2). °More information on disease and virus naming °World Health Organization (WHO): www.who.int/emergencies/diseases/novel-coronavirus-2019/technical-guidance/naming-the-coronavirus-disease-(covid-2019)-and-the-virus-that-causes-it °Who is at risk for complications from coronavirus disease? °Some people may be at higher risk for complications from coronavirus disease. This includes older adults and people who have chronic diseases, such as heart disease, diabetes, and lung disease. °If you are at higher risk for complications, take these extra precautions: °· Avoid close contact with people who are sick or have a fever or cough. Stay at least 3-6 ft (1-2 m) away from them, if possible. °· Wash your hands often with soap and  water for at least 20 seconds. °· Avoid touching your face, mouth, nose, or eyes. °· Keep supplies on hand at home, such as food, medicine, and cleaning supplies. °· Stay home as much as possible. °· Avoid social gatherings and travel. °How does coronavirus disease spread? °The virus that causes coronavirus disease spreads easily from person to person (is contagious). There are also cases of community-spread disease. This means the disease has spread to: °· People who have no known contact with other infected people. °· People who have not traveled to areas where there are known cases. °It appears to spread from one person to another through droplets from coughing or sneezing. °Can I get the virus from touching surfaces or objects? °There is still a lot that we do not know about the virus that causes coronavirus disease. Scientists are basing a lot of information on what they know about similar viruses, such as: °· Viruses cannot generally survive on surfaces for long. They need a human body (host) to survive. °· It is more likely that the virus is spread by close contact with people who are sick (direct contact), such as through: °? Shaking hands or hugging. °? Breathing in respiratory droplets that travel through the air. This can happen when an infected person coughs or sneezes on or near other people. °· It is less likely that the virus is spread when a person touches a surface or object that has the virus on it (indirect contact). The virus may be able to enter the body if the person touches a surface or object and then touches his or her face, eyes, nose, or mouth. °Can a person spread the virus without having symptoms of the disease? °It may be possible for the virus to spread before a person   has symptoms of the disease, but this is most likely not the main way the virus is spreading. It is more likely for the virus to spread by being in close contact with people who are sick and breathing in the respiratory  droplets of a sick person's cough or sneeze. °What are the symptoms of coronavirus disease? °Symptoms vary from person to person and can range from mild to severe. Symptoms may include: °· Fever. °· Cough. °· Tiredness, weakness, or fatigue. °· Fast breathing or feeling short of breath. °These symptoms can appear anywhere from 2 to 14 days after you have been exposed to the virus. If you develop symptoms, call your health care provider. People with severe symptoms may need hospital care. °If I am exposed to the virus, how long does it take before symptoms start? °Symptoms of coronavirus disease may appear anywhere from 2 to 14 days after a person has been exposed to the virus. If you develop symptoms, call your health care provider. °Should I be tested for this virus? °Your health care provider will decide whether to test you based on your symptoms, history of exposure, and your risk factors. °How does a health care provider test for this virus? °Health care providers will collect samples to send for testing. Samples may include: °· Taking a swab of fluid from the nose. °· Taking fluid from the lungs by having you cough up mucus (sputum) into a sterile cup. °· Taking a blood sample. °· Taking a stool or urine sample. °Is there a treatment or vaccine for this virus? °Currently, there is no vaccine to prevent coronavirus disease. Also, there are no medicines like antibiotics or antivirals to treat the virus. A person who becomes sick is given supportive care, which means rest and fluids. A person may also relieve his or her symptoms by using over-the-counter medicines that treat sneezing, coughing, and runny nose. These are the same medicines that a person takes for the common cold. °If you develop symptoms, call your health care provider. People with severe symptoms may need hospital care. °What can I do to protect myself and my family from this virus? ° °  ° °You can protect yourself and your family by taking the  same actions that you would take to prevent the spread of other viruses. Take the following actions: °· Wash your hands often with soap and water for at least 20 seconds. If soap and water are not available, use alcohol-based hand sanitizer. °· Avoid touching your face, mouth, nose, or eyes. °· Cough or sneeze into a tissue, sleeve, or elbow. Do not cough or sneeze into your hand or the air. °? If you cough or sneeze into a tissue, throw it away immediately and wash your hands. °· Disinfect objects and surfaces that you frequently touch every day. °· Avoid close contact with people who are sick or have a fever or cough. Stay at least 3-6 ft (1-2 m) away from them, if possible. °· Stay home if you are sick, except to get medical care. Call your health care provider before you get medical care. °· Make sure your vaccines are up to date. Ask your health care provider what vaccines you need. °What should I do if I need to travel? °Follow travel recommendations from your local health authority, the CDC, and WHO. °Travel information and advice °· Centers for Disease Control and Prevention (CDC): www.cdc.gov/coronavirus/2019-ncov/travelers/index.html °· World Health Organization (WHO): www.who.int/emergencies/diseases/novel-coronavirus-2019/travel-advice °Know the risks and take action to protect your health °·   You are at higher risk of getting coronavirus disease if you are traveling to areas with an outbreak or if you are exposed to travelers from areas with an outbreak. °· Wash your hands often and practice good hygiene to lower the risk of catching or spreading the virus. °What should I do if I am sick? °General instructions to stop the spread of infection °· Wash your hands often with soap and water for at least 20 seconds. If soap and water are not available, use alcohol-based hand sanitizer. °· Cough or sneeze into a tissue, sleeve, or elbow. Do not cough or sneeze into your hand or the air. °· If you cough or  sneeze into a tissue, throw it away immediately and wash your hands. °· Stay home unless you must get medical care. Call your health care provider or local health authority before you get medical care. °· Avoid public areas. Do not take public transportation, if possible. °· If you can, wear a mask if you must go out of the house or if you are in close contact with someone who is not sick. °Keep your home clean °· Disinfect objects and surfaces that are frequently touched every day. This may include: °? Counters and tables. °? Doorknobs and light switches. °? Sinks and faucets. °? Electronics such as phones, remote controls, keyboards, computers, and tablets. °· Wash dishes in hot, soapy water or use a dishwasher. Air-dry your dishes. °· Wash laundry in hot water. °Prevent infecting other household members °· Let healthy household members care for children and pets, if possible. If you have to care for children or pets, wash your hands often and wear a mask. °· Sleep in a different bedroom or bed, if possible. °· Do not share personal items, such as razors, toothbrushes, deodorant, combs, brushes, towels, and washcloths. °Where to find more information °Centers for Disease Control and Prevention (CDC) °· Information and news updates: www.cdc.gov/coronavirus/2019-ncov °World Health Organization (WHO) °· Information and news updates: www.who.int/emergencies/diseases/novel-coronavirus-2019 °· Coronavirus health topic: www.who.int/health-topics/coronavirus °· Questions and answers on COVID-19: www.who.int/news-room/q-a-detail/q-a-coronaviruses °· Global tracker: who.sprinklr.com °American Academy of Pediatrics (AAP) °· Information for families: www.healthychildren.org/English/health-issues/conditions/chest-lungs/Pages/2019-Novel-Coronavirus.aspx °The coronavirus situation is changing rapidly. Check your local health authority website or the CDC and WHO websites for updates and news. °When should I contact a health care  provider? °· Contact your health care provider if you have symptoms of an infection, such as fever or cough, and you: °? Have been near anyone who is known to have coronavirus disease. °? Have come into contact with a person who is suspected to have coronavirus disease. °? Have traveled outside of the country. °When should I get emergency medical care? °· Get help right away by calling your local emergency services (911 in the [place] if you have: °? Trouble breathing. °? Pain or pressure in your chest. °? Confusion. °? Blue-tinged lips and fingernails. °? Difficulty waking from sleep. °? Symptoms that get worse. °Let the emergency medical personnel know if you think you have coronavirus disease. °Summary °· A new respiratory virus is spreading from person to person and causing COVID-19 (coronavirus disease). °· The virus that causes COVID-19 appears to spread easily. It spreads from one person to another through droplets from coughing or sneezing. °· Older adults and those with chronic diseases are at higher risk of disease. If you are at higher risk for complications, take extra precautions. °· There is currently no vaccine to prevent coronavirus disease. There are no medicines, such as antibiotics or   antivirals, to treat the virus. °· You can protect yourself and your family by washing your hands often, avoiding touching your face, and covering your coughs and sneezes. °This information is not intended to replace advice given to you by your health care provider. Make sure you discuss any questions you have with your health care provider. °Document Released: 07/10/2018 Document Revised: 07/10/2018 Document Reviewed: 07/10/2018 °Elsevier Patient Education © 2020 [employer] Inc[employer] ° °

## 2019-01-03 NOTE — Assessment & Plan Note (Signed)
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

## 2019-07-10 ENCOUNTER — Other Ambulatory Visit: Payer: Self-pay | Admitting: Neurology

## 2019-07-23 ENCOUNTER — Telehealth: Payer: Self-pay | Admitting: *Deleted

## 2019-07-23 ENCOUNTER — Encounter: Payer: Self-pay | Admitting: Neurology

## 2019-07-23 ENCOUNTER — Other Ambulatory Visit: Payer: Self-pay

## 2019-07-23 ENCOUNTER — Ambulatory Visit (INDEPENDENT_AMBULATORY_CARE_PROVIDER_SITE_OTHER): Payer: Self-pay | Admitting: Neurology

## 2019-07-23 VITALS — BP 112/80 | HR 75 | Ht 67.0 in | Wt 192.0 lb

## 2019-07-23 DIAGNOSIS — G40009 Localization-related (focal) (partial) idiopathic epilepsy and epileptic syndromes with seizures of localized onset, not intractable, without status epilepticus: Secondary | ICD-10-CM

## 2019-07-23 MED ORDER — TOPIRAMATE 25 MG PO TABS: ORAL_TABLET | ORAL | 3 refills | Status: DC

## 2019-07-23 NOTE — Progress Notes (Signed)
NEUROLOGY FOLLOW UP OFFICE NOTE  Veronica Stanton [id number] 01/03/1981  HISTORY OF PRESENT ILLNESS: I had the pleasure of seeing Veronica Stanton in follow-up in the neurology clinic on 07/23/2019.  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 She reports doing very well since her last visit. She denies any auras of seeing cursive writing on the wall since 10/2017. No loss of consciousness since 2016. She is on low dose Topiramate 25mg , instructed to take 3 tabs qhs but states that most of the time she takes 2 tabs qhs. She feels alert in the morning. She denies any staring/unresponsive episodes, gaps in time, olfactory/gustatory hallucinations, focal numbness/tingling/weakness, myoclonic jerks. She has not been having any headaches. No dizziness, vision changes, no falls. 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 She denies getting lost driving, no missed bills or medication. 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 She had a new job in December 2020 but was let go after 4 weeks, she states her mood is okay.   History on Initial Assessment 07/30/2014: This is a very pleasant 39 yo RH woman with no significant past medical who had an episode of loss of consciousness with urinary incontinence last 07/19/14. She reports an evaluation in 2013 for palpitations that started at age 10 where palpitations would wake her up, lasting 5 minutes. She was evaluated by Cardiology at that time, with normal echocardiogram and 48-hour Holter monitor. She denies any further episodes of palpitations in the past 3 years. She also reports episodes that wake her up from night when she would see writing on the wall, described as black cursive writing. These were separate from the palpitations. She  has had only one episode of palpitations during wakefulness that occurred while driving 3 years ago. She denies any visual symptoms during daytime. No associated confusion. On 07/19/14, she recalls waking up and seeing the black cursive writing on the wall. She then has no recollection of events until EMS was there. Her daughter was sleeping in her room and heard her make funny noises, open her eyes and get up to go to the bathroom. She then fell to the floor, no report of convulsive activity. She had urinary incontinence, no tongue bite. When EMS arrived, she reports being aware of her surroundings, and recalls cleaning herself up before going to [facility] ER. In the ER, CBC and BMP were unremarkable. EKG reported sinus tachycardia, nonspecific T wave abnormality. She was discharged home. She denied any alcohol intake, no sleep deprivation.  She reports episodes once a month where she feels a "weird feeling, like you're scared and would dream something." This lasts around 2 seconds and she states she has "learned to control" them. The last episode was in May 2016.   Epilepsy Risk Factors: She had a normal birth and early development. There is no history of febrile convulsions, CNS infections such as meningitis/encephalitis, significant traumatic brain injury, neurosurgical procedures, or family history of seizures.  Diagnostic Data: I personally reviewed her routine EEG which was normal. Her 24-hour EEG was abnormal, with occasional right anterior temporal sharp waves seen exclusively in sleep.  Her MRI brain with and without contrast was normal, hippocampi symmetric with no abnormal signal or enhancement seen.    PAST MEDICAL HISTORY: Past  Medical History:  Diagnosis Date  . No diagnosis   . Palpitations     MEDICATIONS: Current Outpatient Medications on File Prior to Visit  Medication Sig Dispense Refill  . HYDROcodone-acetaminophen (NORCO/VICODIN) 5-325 MG tablet Take 1 tablet by mouth at bedtime  as needed for severe pain. 3 tablet 0  . ibuprofen (ADVIL) 600 MG tablet Take 1 tablet (600 mg total) by mouth every 6 (six) hours as needed. 30 tablet 0  . topiramate (TOPAMAX) 25 MG tablet Take 3 tablets daily 270 tablet 3    ALLERGIES: No Known Allergies  FAMILY HISTORY: Family History  Problem Relation Age of Onset  . Stroke Neg Hx   . Heart disease Neg Hx   . Mental illness Neg Hx     SOCIAL HISTORY: Social History   Socioeconomic History  . Marital status: Single    Spouse name: Not on file  . Number of children: Not on file  . Years of education: Not on file  . Highest education level: Not on file  Occupational History  . Not on file  Tobacco Use  . Smoking status: Never Smoker  . Smokeless tobacco: Never Used  Substance and Sexual Activity  . Alcohol use: No  . Drug use: No  . Sexual activity: Not on file  Other Topics Concern  . Not on file  Social History Narrative  . Not on file   Social Determinants of Health   Financial Resource Strain:   . Difficulty of Paying Living Expenses:   Food Insecurity:   . Worried About Programme researcher, broadcasting/film/video in the Last Year:   . Barista in the Last Year:   Transportation Needs:   . Freight forwarder (Medical):   Marland Kitchen Lack of Transportation (Non-Medical):   Physical Activity:   . Days of Exercise per Week:   . Minutes of Exercise per Session:   Stress:   . Feeling of Stress :   Social Connections:   . Frequency of Communication with Friends and Family:   . Frequency of Social Gatherings with Friends and Family:   . Attends Religious Services:   . Active Member of Clubs or Organizations:   . Attends Banker Meetings:   Marland Kitchen Marital Status:   Intimate Partner Violence:   . Fear of Current or Ex-Partner:   . Emotionally Abused:   Marland Kitchen Physically Abused:   . Sexually Abused:     REVIEW OF SYSTEMS: Constitutional: No fevers, chills, or sweats, no generalized fatigue, change in appetite Eyes: No  visual changes, double vision, eye pain Ear, nose and throat: No hearing loss, ear pain, nasal congestion, sore throat Cardiovascular: No chest pain, palpitations Respiratory:  No shortness of breath at rest or with exertion, wheezes GastrointestinaI: No nausea, vomiting, diarrhea, abdominal pain, fecal incontinence Genitourinary:  No dysuria, urinary retention or frequency Musculoskeletal:  No neck pain, back pain Integumentary: No rash, pruritus, skin lesions Neurological: as above Psychiatric: No depression, insomnia, anxiety Endocrine: No palpitations, fatigue, diaphoresis, mood swings, change in appetite, change in weight, increased thirst Hematologic/Lymphatic:  No anemia, purpura, petechiae. Allergic/Immunologic: no itchy/runny eyes, nasal congestion, recent allergic reactions, rashes  PHYSICAL EXAM: Vitals:   07/23/19 0833  BP: 112/80  Pulse: 75  SpO2: 100%   General: No acute distress Head:  Normocephalic/atraumatic Skin/Extremities: No rash, no edema Neurological Exam: alert and oriented to person, place, and time. No aphasia or dysarthria. Fund of knowledge is appropriate.  Recent and remote memory are  intact.  Attention and concentration are normal.  Cranial nerves: Pupils equal, round, reactive to light. Extraocular movements intact with no nystagmus. Visual fields full. No facial asymmetry. Motor: Bulk and tone normal, muscle strength 5/5 throughout with no pronator drift.  Deep tendon reflexes +1 throughout. Finger to nose testing intact.  Gait narrow-based and steady  IMPRESSION: This is a very pleasant 39 yo RH woman with an episode of loss of consciousness last 07/19/14. This was preceded by visual hallucinations of seeing black cursive writing on the wall. She reports these visual symptoms started at age 60, waking her up from sleep. 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 Her 24-hour EEG had shown occasional right anterior temporal epileptiform discharges exclusively in sleep. No electrographic seizures. MRI brain normal. She has been doing well with no further episodes of loss of consciousness since April 2016, no episodes of visual hallucinations (visual aura) since August 2019. Continue Topiramate 25mg  2 tabs qhs, she plans to take 3 tabs qhs when she starts phentermine for weight loss. 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 We discussed doing a 24-hour EEG, if abnormal, would either continue on Topiramate or switch to a different AED (which she has been hesitant to do in the past). She is aware of [place] driving laws to stop driving after an episode of loss of consciousness, until 6 months event-free. She will follow-up in 4-5 months and knows to call our office for any problems.   Thank you for allowing me to participate in her care.  Please do not hesitate to call for any questions or concerns.   , M.D.   CC: Dr Patrcia Dolly

## 2019-07-23 NOTE — Telephone Encounter (Signed)
LMOM to call back to schedule 24 hour ambulatory EEG. Offered Monday May 3rd 7:30 or 1:00 pm. Monday May 10 pretty much open.

## 2019-07-23 NOTE — Patient Instructions (Signed)
Great seeing you! Schedule 24-hour EEG. Continue Topamax. Follow-up in 4-5 months, call for any changes.  Seizure Precautions: 1. If medication has been prescribed for you to prevent seizures, take it exactly as directed.  Do not stop taking the medicine without talking to your doctor first, even if you have not had a seizure in a long time.   2. Avoid activities in which a seizure would cause danger to yourself or to others.  Don't operate dangerous machinery, swim alone, or climb in high or dangerous places, such as on ladders, roofs, or girders.  Do not drive unless your doctor says you may.  3. If you have any warning that you may have a seizure, lay down in a safe place where you can't hurt yourself.    4.  No driving for 6 months from last seizure, as per [facility].   Please refer to the following link on the Epilepsy Foundation of America's website for more information: http://www.epilepsyfoundation.org/answerplace/Social/driving/drivingu.cfm   5.  Maintain good sleep hygiene. Avoid alcohol.  6.  Notify your neurology if you are planning pregnancy or if you become pregnant.  7.  Contact your doctor if you have any problems that may be related to the medicine you are taking.  8.  Call 911 and bring the patient back to the ED if:        A.  The seizure lasts longer than 5 minutes.       B.  The patient doesn't awaken shortly after the seizure  C.  The patient has new problems such as difficulty seeing, speaking or moving  D.  The patient was injured during the seizure  E.  The patient has a temperature over 102 F (39C)  F.  The patient vomited and now is having trouble breathing

## 2019-07-24 IMAGING — DX DG RIBS W/ CHEST 3+V*L*
3 series · 3 of 3 positions shown · non-contrast
Comparison: Radiographs June 17, 2011.

CLINICAL DATA: Left rib pain for 2 weeks without known injury.

EXAM:
LEFT RIBS AND CHEST - 3+ VIEW

[chest pa]
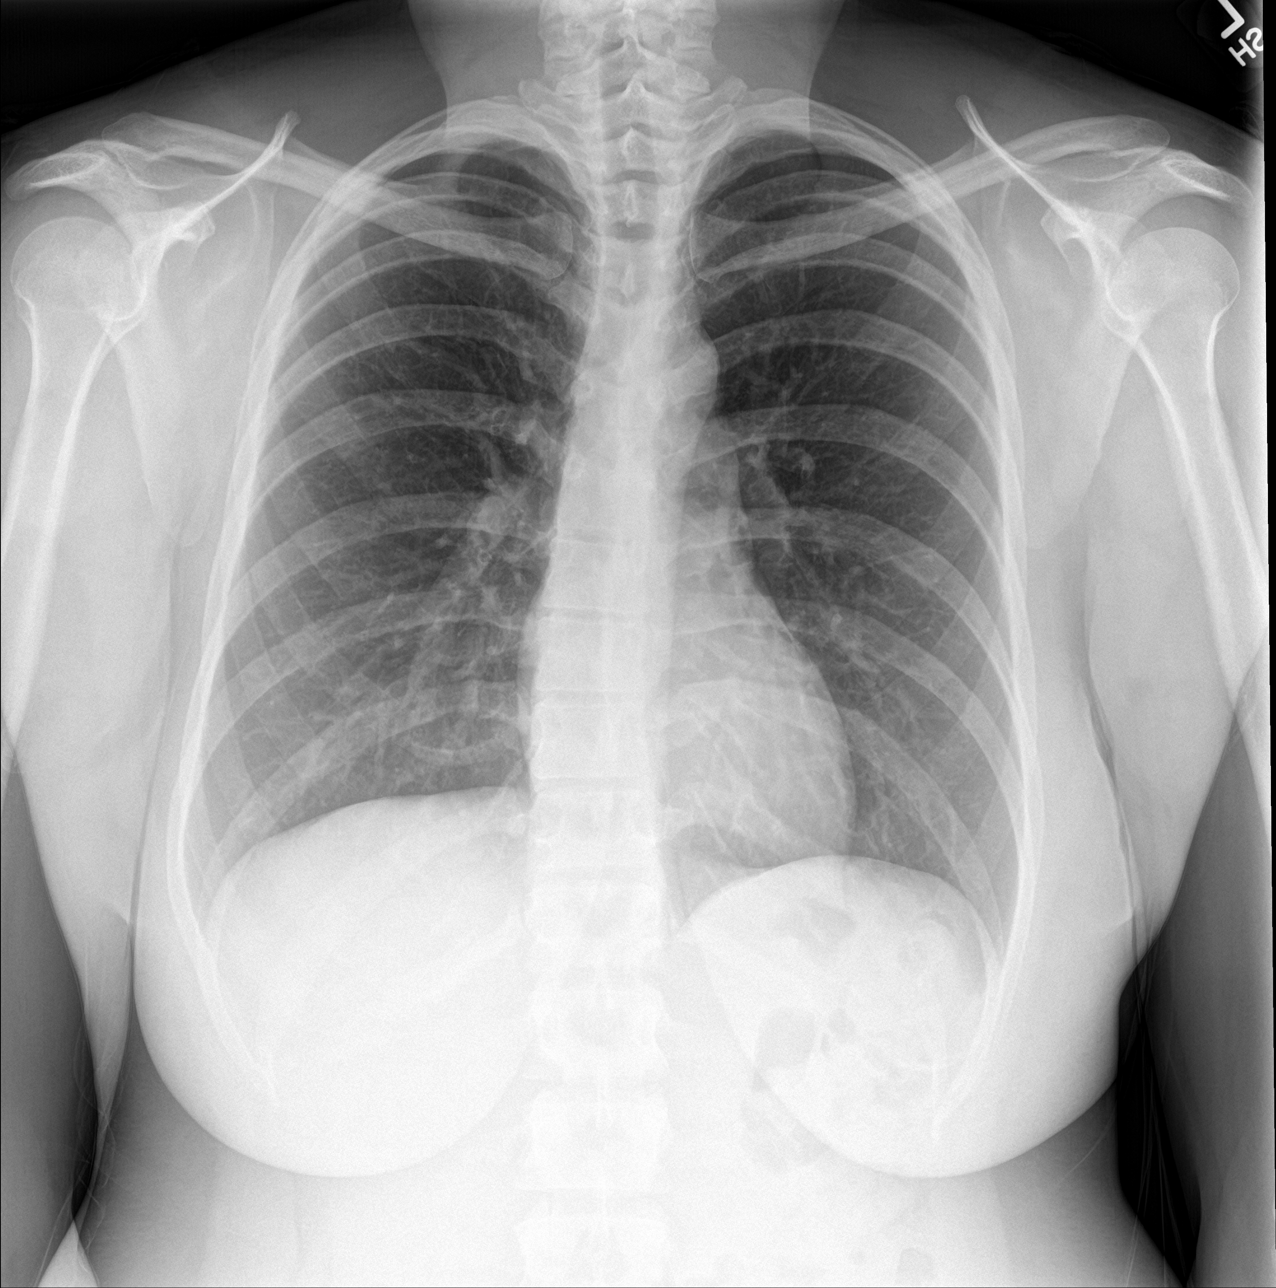

[rib pa]
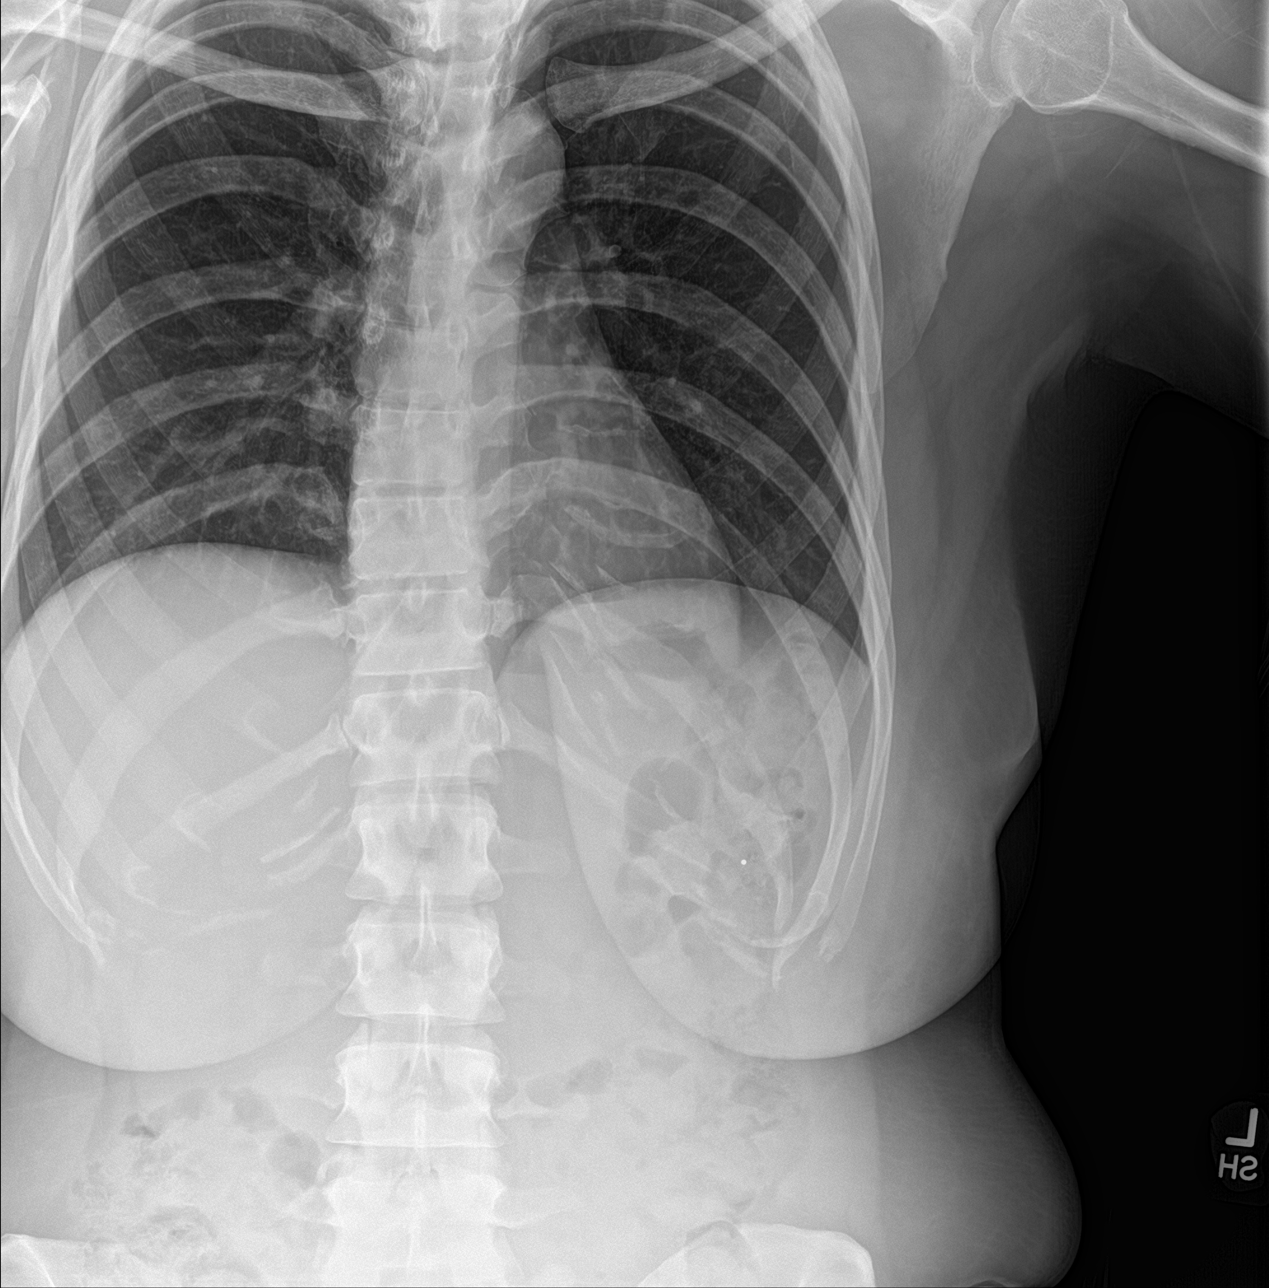

[rib pa obl]
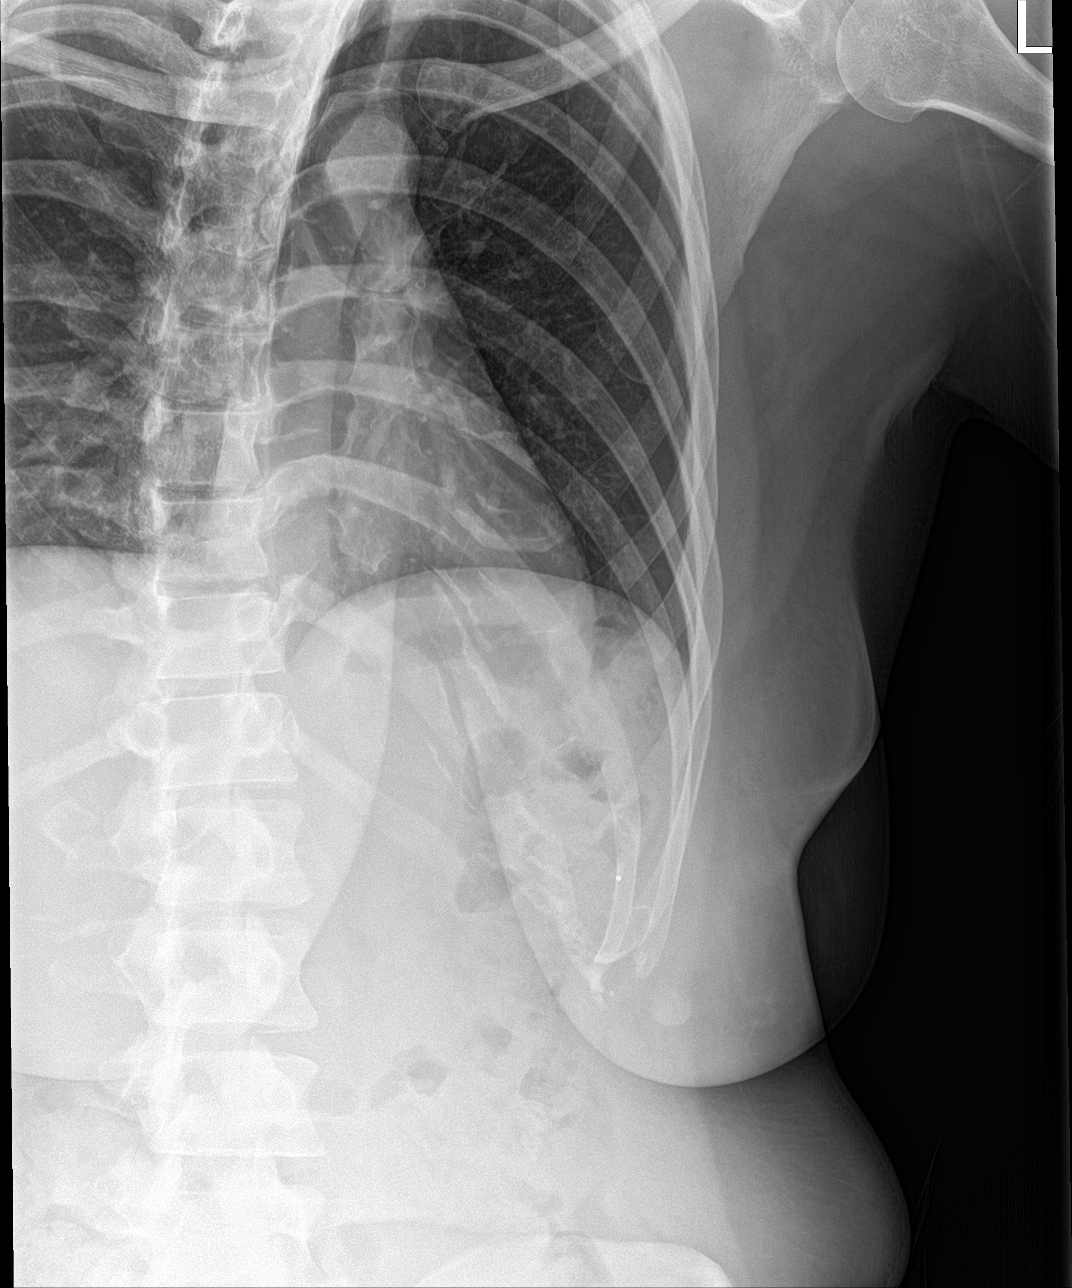

[3 of 3 positions shown; findings below may reference images not displayed]

FINDINGS: No fracture or other bone lesions are seen involving the ribs. There
is no evidence of pneumothorax or pleural effusion. Both lungs are
clear. Heart size and mediastinal contours are within normal limits.
IMPRESSION: Normal left ribs.  No acute cardiopulmonary abnormality seen.

## 2019-08-29 NOTE — Telephone Encounter (Signed)
LMOM offered Wednesday June 9th at 11am or June 16th wide open as of today. Call me back so we can schedule 24 hour ambulatory EEG.

## 2019-09-09 ENCOUNTER — Telehealth: Payer: Self-pay | Admitting: *Deleted

## 2019-09-09 NOTE — Telephone Encounter (Signed)
Recording stated "I'm sorry your call cannot be completed at this time. Please hang up and try again later".

## 2019-09-24 ENCOUNTER — Other Ambulatory Visit: Payer: Self-pay

## 2019-09-24 ENCOUNTER — Ambulatory Visit (INDEPENDENT_AMBULATORY_CARE_PROVIDER_SITE_OTHER): Payer: Self-pay | Admitting: Family Medicine

## 2019-09-24 ENCOUNTER — Encounter: Payer: Self-pay | Admitting: Family Medicine

## 2019-09-24 MED ORDER — SAXENDA 18 MG/3ML ~~LOC~~ SOPN: 3.0000 mg | PEN_INJECTOR | Freq: Every day | SUBCUTANEOUS | 3 refills | Status: DC

## 2019-09-24 MED ORDER — NOVOFINE 32G X 6 MM MISC: 1.0000 | Freq: Every day | 2 refills | Status: DC

## 2019-09-25 LAB — CBC WITH DIFFERENTIAL/PLATELET
Basophils Absolute: 0.1 10*3/uL (ref 0.0–0.1)
Basophils Relative: 1 % (ref 0.0–3.0)
Eosinophils Absolute: 0.1 10*3/uL (ref 0.0–0.7)
Eosinophils Relative: 1.1 % (ref 0.0–5.0)
HCT: 36.5 % (ref 36.0–46.0)
Hemoglobin: 12.2 g/dL (ref 12.0–15.0)
Lymphocytes Relative: 32.5 % (ref 12.0–46.0)
Lymphs Abs: 2.5 10*3/uL (ref 0.7–4.0)
MCHC: 33.4 g/dL (ref 30.0–36.0)
MCV: 93.1 fl (ref 78.0–100.0)
Monocytes Absolute: 0.8 10*3/uL (ref 0.1–1.0)
Monocytes Relative: 10.8 % (ref 3.0–12.0)
Neutro Abs: 4.2 10*3/uL (ref 1.4–7.7)
Neutrophils Relative %: 54.6 % (ref 43.0–77.0)
Platelets: 260 10*3/uL (ref 150.0–400.0)
RBC: 3.93 Mil/uL (ref 3.87–5.11)
RDW: 14.6 % (ref 11.5–15.5)
WBC: 7.7 10*3/uL (ref 4.0–10.5)

## 2019-09-25 LAB — LIPID PANEL
Cholesterol: 180 mg/dL (ref 0–200)
HDL: 50.3 mg/dL (ref >39.00)
LDL Cholesterol: 119 mg/dL — ABNORMAL HIGH (ref 0–99)
NonHDL: 130.03
Total CHOL/HDL Ratio: 4
Triglycerides: 54 mg/dL (ref 0.0–149.0)
VLDL: 10.8 mg/dL (ref 0.0–40.0)

## 2019-09-25 LAB — COMPREHENSIVE METABOLIC PANEL
ALT: 10 U/L (ref 0–35)
AST: 11 U/L (ref 0–37)
Albumin: 3.9 g/dL (ref 3.5–5.2)
Alkaline Phosphatase: 40 U/L (ref 39–117)
BUN: 11 mg/dL (ref 6–23)
CO2: 25 mEq/L (ref 19–32)
Calcium: 9.2 mg/dL (ref 8.4–10.5)
Chloride: 105 mEq/L (ref 96–112)
Creatinine, Ser: 0.88 mg/dL (ref 0.40–1.20)
GFR: 85.64 mL/min (ref >60.00)
Glucose, Bld: 91 mg/dL (ref 70–99)
Potassium: 4.3 mEq/L (ref 3.5–5.1)
Sodium: 136 mEq/L (ref 135–145)
Total Bilirubin: 0.2 mg/dL (ref 0.2–1.2)
Total Protein: 7.2 g/dL (ref 6.0–8.3)

## 2019-09-25 LAB — TSH: TSH: 0.88 u[IU]/mL (ref 0.35–4.50)

## 2019-09-25 NOTE — Progress Notes (Signed)
Patient ID: Veronica Stanton, female    DOB: 07/26/1980  Age: 39 y.o. MRN: [id number]    Subjective:  Subjective  HPI Veronica Stanton presents for a discuss about weight loss and her options  Pt has been trying to eat better and exercises a lot  She is struggling to lose weight   Review of Systems  Constitutional: Negative for appetite change, diaphoresis, fatigue and unexpected weight change.  Eyes: Negative for pain, redness and visual disturbance.  Respiratory: Negative for cough, chest tightness, shortness of breath and wheezing.   Cardiovascular: Negative for chest pain, palpitations and leg swelling.  Endocrine: Negative for cold intolerance, heat intolerance, polydipsia, polyphagia and polyuria.  Genitourinary: Negative for difficulty urinating, dysuria and frequency.  Neurological: Negative for dizziness, light-headedness, numbness and headaches.    History Past Medical History:  Diagnosis Date  . No diagnosis   . Palpitations     She has a past surgical history that includes negative.   Her family history is not on file.She reports that she has never smoked. She has never used smokeless tobacco. She reports that she does not drink alcohol and does not use drugs.  Current Outpatient Medications on File Prior to Visit  Medication Sig Dispense Refill  . topiramate (TOPAMAX) 25 MG tablet Take 3 tablets daily 270 tablet 3   No current facility-administered medications on file prior to visit.     Objective:  Objective  Physical Exam Vitals and nursing note reviewed.  Constitutional:      Appearance: She is well-developed.  HENT:     Head: Normocephalic and atraumatic.  Eyes:     Conjunctiva/sclera: Conjunctivae normal.  Neck:     Thyroid: No thyromegaly.     Vascular: No carotid bruit or JVD.  Cardiovascular:     Rate and Rhythm: Normal rate and regular rhythm.     Heart sounds: Normal heart sounds. No murmur heard.   Pulmonary:     Effort: Pulmonary effort  is normal. No respiratory distress.     Breath sounds: Normal breath sounds. No wheezing or rales.  Chest:     Chest wall: No tenderness.  Musculoskeletal:     Cervical back: Normal range of motion and neck supple.  Neurological:     Mental Status: She is alert and oriented to person, place, and time.    BP 118/74 (BP Location: Left Arm, Patient Position: Sitting, Cuff Size: Normal)   Pulse 76   Temp 97.7 F (36.5 C) (Temporal)   Resp 18   Ht 5\' 7"  (1.702 m)   Wt 200 lb (90.7 kg)   SpO2 100%   BMI 31.32 kg/m  Wt Readings from Last 3 Encounters:  09/24/19 200 lb (90.7 kg)  07/23/19 192 lb (87.1 kg)  01/03/19 189 lb (85.7 kg)     Lab Results  Component Value Date   WBC 6.3 02/16/2018   HGB 12.7 02/16/2018   HCT 37.9 02/16/2018   PLT 230.0 02/16/2018   GLUCOSE 98 02/16/2018   ALT 11 02/16/2018   AST 12 02/16/2018   NA 139 02/16/2018   K 3.6 02/16/2018   CL 109 02/16/2018   CREATININE 0.92 02/16/2018   BUN 10 02/16/2018   CO2 25 02/16/2018   TSH 0.80 02/16/2018    No results found.   Assessment & Plan:  Plan  I am having Jamilia T. Paolo start on Saxenda and NovoFine. I am also having her maintain her topiramate.  Meds ordered this encounter  Medications  . Liraglutide -Weight Management (SAXENDA) 18 MG/3ML SOPN    Sig: Inject 0.5 mLs (3 mg total) into the skin daily.    Dispense:  5 pen    Refill:  3  . Insulin Pen Needle (NOVOFINE) 32G X 6 MM MISC    Sig: 1 Dose by Does not apply route daily.    Dispense:  100 each    Refill:  2    Problem List Items Addressed This Visit    None    Visit Diagnoses    Morbid obesity (HCC)    -  Primary   Relevant Medications   Liraglutide -Weight Management (SAXENDA) 18 MG/3ML SOPN   Insulin Pen Needle (NOVOFINE) 32G X 6 MM MISC   Other Relevant Orders   Lipid panel   CBC with Differential/Platelet   TSH   Comprehensive metabolic panel      Follow-up: No follow-ups on file.  Veronica Schultz, DO

## 2019-09-25 NOTE — Patient Instructions (Signed)

## 2019-10-11 ENCOUNTER — Telehealth: Payer: Self-pay | Admitting: Family Medicine

## 2019-10-11 NOTE — Telephone Encounter (Signed)
Liraglutide -Weight Management (SAXENDA) 18 MG/3ML SOPN  Patient states cost of pocket is $1600.00. Patient is looking for any samples that we may have.   Please advise

## 2019-10-11 NOTE — Telephone Encounter (Signed)
We do not have any.

## 2019-10-11 NOTE — Telephone Encounter (Signed)
Please advise 

## 2019-10-11 NOTE — Telephone Encounter (Signed)
Patient called in reference to a weight loss medication that she is currently taking Bernie Covey), Per patient her insurance does not cover the medication.  Patient is requesting a sample of medication please advise

## 2019-10-15 NOTE — Telephone Encounter (Signed)
Pt notified about samples. Offered patient a GoodRx card and was advised that we weren't sure if the saving card would help with the cost. Saving card placed the front for pick up

## 2019-10-21 ENCOUNTER — Ambulatory Visit (INDEPENDENT_AMBULATORY_CARE_PROVIDER_SITE_OTHER): Payer: Medicaid Other | Admitting: Family Medicine

## 2019-10-21 ENCOUNTER — Encounter: Payer: Self-pay | Admitting: Family Medicine

## 2019-10-21 ENCOUNTER — Other Ambulatory Visit: Payer: Self-pay

## 2019-10-21 DIAGNOSIS — E669 Obesity, unspecified: Secondary | ICD-10-CM | POA: Insufficient documentation

## 2019-10-21 NOTE — Progress Notes (Signed)
Patient ID: Veronica Stanton, female    DOB: 19-Jun-1980  Age: 39 y.o. MRN: [id number]    Subjective:  Subjective  HPI Veronica Stanton presents for f/u weight.  She was doing great with the saxenda but has no ins right now so she could not afford the prescription   She is hoping to get a job soon and then have ins She is exercising and watching her diet   Review of Systems  Constitutional: Negative for appetite change, diaphoresis, fatigue and unexpected weight change.  Eyes: Negative for pain, redness and visual disturbance.  Respiratory: Negative for cough, chest tightness, shortness of breath and wheezing.   Cardiovascular: Negative for chest pain, palpitations and leg swelling.  Endocrine: Negative for cold intolerance, heat intolerance, polydipsia, polyphagia and polyuria.  Genitourinary: Negative for difficulty urinating, dysuria and frequency.  Neurological: Negative for dizziness, light-headedness, numbness and headaches.    History Past Medical History:  Diagnosis Date  . No diagnosis   . Palpitations     She has a past surgical history that includes negative.   Her family history is not on file.She reports that she has never smoked. She has never used smokeless tobacco. She reports that she does not drink alcohol and does not use drugs.  Current Outpatient Medications on File Prior to Visit  Medication Sig Dispense Refill  . Insulin Pen Needle (NOVOFINE) 32G X 6 MM MISC 1 Dose by Does not apply route daily. 100 each 2  . Liraglutide -Weight Management (SAXENDA) 18 MG/3ML SOPN Inject 0.5 mLs (3 mg total) into the skin daily. 5 pen 3  . topiramate (TOPAMAX) 25 MG tablet Take 3 tablets daily 270 tablet 3   No current facility-administered medications on file prior to visit.     Objective:  Objective  Physical Exam Vitals and nursing note reviewed.  Constitutional:      Appearance: She is well-developed.  HENT:     Head: Normocephalic and atraumatic.  Eyes:      Conjunctiva/sclera: Conjunctivae normal.  Neck:     Thyroid: No thyromegaly.     Vascular: No carotid bruit or JVD.  Cardiovascular:     Rate and Rhythm: Normal rate and regular rhythm.     Heart sounds: Normal heart sounds. No murmur heard.   Pulmonary:     Effort: Pulmonary effort is normal. No respiratory distress.     Breath sounds: Normal breath sounds. No wheezing or rales.  Chest:     Chest wall: No tenderness.  Musculoskeletal:     Cervical back: Normal range of motion and neck supple.  Neurological:     Mental Status: She is alert and oriented to person, place, and time.    BP 128/70   Pulse 75   Temp 98.2 F (36.8 C) (Oral)   Resp 18   Ht 5\' 7"  (1.702 m)   Wt 197 lb 6.4 oz (89.5 kg)   SpO2 96%   BMI 30.92 kg/m  Wt Readings from Last 3 Encounters:  10/21/19 197 lb 6.4 oz (89.5 kg)  09/24/19 200 lb (90.7 kg)  07/23/19 192 lb (87.1 kg)     Lab Results  Component Value Date   WBC 7.7 09/24/2019   HGB 12.2 09/24/2019   HCT 36.5 09/24/2019   PLT 260.0 09/24/2019   GLUCOSE 91 09/24/2019   CHOL 180 09/24/2019   TRIG 54.0 09/24/2019   HDL 50.30 09/24/2019   LDLCALC 119 (H) 09/24/2019   ALT 10 09/24/2019   AST  11 09/24/2019   NA 136 09/24/2019   K 4.3 09/24/2019   CL 105 09/24/2019   CREATININE 0.88 09/24/2019   BUN 11 09/24/2019   CO2 25 09/24/2019   TSH 0.88 09/24/2019    No results found.   Assessment & Plan:  Plan  I am having Veronica Stanton maintain her topiramate, Saxenda, and NovoFine.  No orders of the defined types were placed in this encounter.   Problem List Items Addressed This Visit    None      Follow-up: Return in about 3 months (around 01/21/2020), or if symptoms worsen or fail to improve.  Donato Schultz, DO

## 2019-10-21 NOTE — Assessment & Plan Note (Signed)
Was able to give her samples of saxenda  Did discuss with her --- the importance of diet, exercise And we will not be able to supple her with many samples if she does not get ins soon  Pt understands and she has the info for healthy weight and wellness once she gets her insurance

## 2019-10-21 NOTE — Patient Instructions (Signed)
Obesity, Adult Obesity is the condition of having too much total body fat. Being overweight or obese means that your weight is greater than what is considered healthy for your body size. Obesity is determined by a measurement called BMI. BMI is an estimate of body fat and is calculated from height and weight. For adults, a BMI of 30 or higher is considered obese. Obesity can lead to other health concerns and major illnesses, including:  Stroke.  Coronary artery disease (CAD).  Type 2 diabetes.  Some types of cancer, including cancers of the colon, breast, uterus, and gallbladder.  Osteoarthritis.  High blood pressure (hypertension).  High cholesterol.  Sleep apnea.  Gallbladder stones.  Infertility problems. What are the causes? Common causes of this condition include:  Eating daily meals that are high in calories, sugar, and fat.  Being born with genes that may make you more likely to become obese.  Having a medical condition that causes obesity, including: ? Hypothyroidism. ? Polycystic ovarian syndrome (PCOS). ? Binge-eating disorder. ? Cushing syndrome.  Taking certain medicines, such as steroids, antidepressants, and seizure medicines.  Not being physically active (sedentary lifestyle).  Not getting enough sleep.  Drinking high amounts of sugar-sweetened beverages, such as soft drinks. What increases the risk? The following factors may make you more likely to develop this condition:  Having a family history of obesity.  Being a woman of African American descent.  Being a man of Hispanic descent.  Living in an area with limited access to: ? Parks, recreation centers, or sidewalks. ? Healthy food choices, such as grocery stores and farmers' markets. What are the signs or symptoms? The main sign of this condition is having too much body fat. How is this diagnosed? This condition is diagnosed based on:  Your BMI. If you are an adult with a BMI of 30 or  higher, you are considered obese.  Your waist circumference. This measures the distance around your waistline.  Your skinfold thickness. Your health care provider may gently pinch a fold of your skin and measure it. You may have other tests to check for underlying conditions. How is this treated? Treatment for this condition often includes changing your lifestyle. Treatment may include some or all of the following:  Dietary changes. This may include developing a healthy meal plan.  Regular physical activity. This may include activity that causes your heart to beat faster (aerobic exercise) and strength training. Work with your health care provider to design an exercise program that works for you.  Medicine to help you lose weight if you are unable to lose 1 pound a week after 6 weeks of healthy eating and more physical activity.  Treating conditions that cause the obesity (underlying conditions).  Surgery. Surgical options may include gastric banding and gastric bypass. Surgery may be done if: ? Other treatments have not helped to improve your condition. ? You have a BMI of 40 or higher. ? You have life-threatening health problems related to obesity. Follow these instructions at home: Eating and drinking   Follow recommendations from your health care provider about what you eat and drink. Your health care provider may advise you to: ? Limit fast food, sweets, and processed snack foods. ? Choose low-fat options, such as low-fat milk instead of whole milk. ? Eat 5 or more servings of fruits or vegetables every day. ? Eat at home more often. This gives you more control over what you eat. ? Choose healthy foods when you eat out. ?   Learn to read food labels. This will help you understand how much food is considered 1 serving. ? Learn what a healthy serving size is. ? Keep low-fat snacks available. ? Limit sugary drinks, such as soda, fruit juice, sweetened iced tea, and flavored  milk.  Drink enough water to keep your urine pale yellow.  Do not follow a fad diet. Fad diets can be unhealthy and even dangerous. Physical activity  Exercise regularly, as told by your health care provider. ? Most adults should get up to 150 minutes of moderate-intensity exercise every week. ? Ask your health care provider what types of exercise are safe for you and how often you should exercise.  Warm up and stretch before being active.  Cool down and stretch after being active.  Rest between periods of activity. Lifestyle  Work with your health care provider and a dietitian to set a weight-loss goal that is healthy and reasonable for you.  Limit your screen time.  Find ways to reward yourself that do not involve food.  Do not drink alcohol if: ? Your health care provider tells you not to drink. ? You are pregnant, may be pregnant, or are planning to become pregnant.  If you drink alcohol: ? Limit how much you use to:  0-1 drink a day for women.  0-2 drinks a day for men. ? Be aware of how much alcohol is in your drink. In the [place] one drink equals one 12 oz bottle of beer (355 mL), one 5 oz glass of wine (148 mL), or one 1 oz glass of hard liquor (44 mL). General instructions  Keep a weight-loss journal to keep track of the food you eat and how much exercise you get.  Take over-the-counter and prescription medicines only as told by your health care provider.  Take vitamins and supplements only as told by your health care provider.  Consider joining a support group. Your health care provider may be able to recommend a support group.  Keep all follow-up visits as told by your health care provider. This is important. Contact a health care provider if:  You are unable to meet your weight loss goal after 6 weeks of dietary and lifestyle changes. Get help right away if you are having:  Trouble breathing.  Suicidal thoughts or behaviors. Summary  Obesity is the  condition of having too much total body fat.  Being overweight or obese means that your weight is greater than what is considered healthy for your body size.  Work with your health care provider and a dietitian to set a weight-loss goal that is healthy and reasonable for you.  Exercise regularly, as told by your health care provider. Ask your health care provider what types of exercise are safe for you and how often you should exercise. This information is not intended to replace advice given to you by your health care provider. Make sure you discuss any questions you have with your health care provider. Document Revised: 11/16/2017 Document Reviewed: 11/16/2017 Elsevier Patient Education  2020 [employer]  

## 2019-12-03 ENCOUNTER — Ambulatory Visit: Payer: Self-pay | Admitting: Neurology

## 2020-01-02 ENCOUNTER — Ambulatory Visit (INDEPENDENT_AMBULATORY_CARE_PROVIDER_SITE_OTHER): Payer: Self-pay | Admitting: Family Medicine

## 2020-01-02 ENCOUNTER — Other Ambulatory Visit: Payer: Self-pay

## 2020-01-02 ENCOUNTER — Encounter: Payer: Self-pay | Admitting: Family Medicine

## 2020-01-02 VITALS — BP 118/80 | HR 82 | Temp 97.8°F | Wt 188.4 lb

## 2020-01-02 DIAGNOSIS — R600 Localized edema: Secondary | ICD-10-CM

## 2020-01-02 DIAGNOSIS — R350 Frequency of micturition: Secondary | ICD-10-CM

## 2020-01-02 LAB — POCT GLYCOSYLATED HEMOGLOBIN (HGB A1C): Hemoglobin A1C: 5 % (ref 4.0–5.6)

## 2020-01-02 LAB — POCT URINALYSIS DIPSTICK
Bilirubin, UA: NEGATIVE
Blood, UA: NEGATIVE
Glucose, UA: NEGATIVE
Ketones, UA: NEGATIVE
Leukocytes, UA: NEGATIVE
Nitrite, UA: NEGATIVE
Protein, UA: NEGATIVE
Spec Grav, UA: 1.01 (ref 1.010–1.025)
Urobilinogen, UA: 0.2 E.U./dL
pH, UA: 7.5 (ref 5.0–8.0)

## 2020-01-02 NOTE — Progress Notes (Signed)
Subjective:    Patient ID: Veronica Stanton, female    DOB: October 20, 1980, 39 y.o.   MRN: [id number]  No chief complaint on file.   HPI Pt is a 39 year old female with past medical history significant for seizures, morbid obesity followed by Dr. Carollee Herter who was seen for acute concern.  Patient endorses R ankle edema x 1 day.  Pt noticed the edema last nigh.  Applied [employer] to her right ankle.  Pt states around noon while at work she noticed the swelling again.  Patient endorses increased standing/walking while at work.  On Monday she endorses sitting for 11 hours and meeting without taking much of a break.  Patient has recently noticed increased urination.  States most urinating every 30 minutes today while at work.  Patient does not drink caffeine.  May have decaf green tea and drink approximately 20 bottles of water per day.  Patient inquires about having her liver checked.  Past Medical History:  Diagnosis Date  . No diagnosis   . Palpitations     No Known Allergies  ROS General: Denies fever, chills, night sweats, changes in weight, changes in appetite HEENT: Denies headaches, ear pain, changes in vision, rhinorrhea, sore throat CV: Denies CP, palpitations, SOB, orthopnea  +b/l LE edema Pulm: Denies SOB, cough, wheezing GI: Denies abdominal pain, nausea, vomiting, diarrhea, constipation GU: Denies dysuria, hematuria, vaginal discharge  + urinary frequency Msk: Denies muscle cramps, joint pains   Neuro: Denies weakness, numbness, tingling Skin: Denies rashes, bruising Psych: Denies depression, anxiety, hallucinations     Objective:    Blood pressure 118/80, pulse 82, temperature 97.8 F (36.6 C), temperature source Oral, weight 188 lb 6.4 oz (85.5 kg), SpO2 98 %.  Gen. Pleasant, well-nourished, in no distress, normal affect   HEENT: [place]/AT, face symmetric, conjunctiva clear, no scleral icterus, PERRLA, EOMI, nares patent without drainage Lungs: no accessory muscle use,  CTAB, no wheezes or rales Cardiovascular: RRR, no m/r/g, b/l nonpitting peripheral edema.  Musculoskeletal: No calf tenderness.  Negative Homans' sign.  No deformities, no cyanosis or clubbing, normal tone Neuro:  A&Ox3, CN II-XII intact, normal gait Skin:  Warm, no lesions/ rash   Wt Readings from Last 3 Encounters:  10/21/19 197 lb 6.4 oz (89.5 kg)  09/24/19 200 lb (90.7 kg)  07/23/19 192 lb (87.1 kg)    Lab Results  Component Value Date   WBC 7.7 09/24/2019   HGB 12.2 09/24/2019   HCT 36.5 09/24/2019   PLT 260.0 09/24/2019   GLUCOSE 91 09/24/2019   CHOL 180 09/24/2019   TRIG 54.0 09/24/2019   HDL 50.30 09/24/2019   LDLCALC 119 (H) 09/24/2019   ALT 10 09/24/2019   AST 11 09/24/2019   NA 136 09/24/2019   K 4.3 09/24/2019   CL 105 09/24/2019   CREATININE 0.88 09/24/2019   BUN 11 09/24/2019   CO2 25 09/24/2019   TSH 0.88 09/24/2019    Assessment/Plan:  Bilateral lower extremity edema  -discussed likely dependent edema from prolonged standing and sitting.  Also consider CHF. -We will obtain labs -Discussed supportive care including elevating lower extremities when sitting/taking breaks, compression socks -given handout - Plan: Brain Natriuretic Peptide, CMP with eGFR([facility]), TSH, T4, free  Urinary frequency  -Discussed possible causes including infection, DM, increased ophthalmologic. -given handout -Hgb A1C 5.0% this visit -UA negative - Plan: POCT urinalysis dipstick, POC HgB A1c, CMP with eGFR  F/u prn with pcp  Grier Mitts, MD

## 2020-01-02 NOTE — Patient Instructions (Signed)
Peripheral Edema  Peripheral edema is swelling that is caused by a buildup of fluid. Peripheral edema most often affects the lower legs, ankles, and feet. It can also develop in the arms, hands, and face. The area of the body that has peripheral edema will look swollen. It may also feel heavy or warm. Your clothes may start to feel tight. Pressing on the area may make a temporary dent in your skin. You may not be able to move your swollen arm or leg as much as usual. There are many causes of peripheral edema. It can happen because of a complication of other conditions such as congestive heart failure, kidney disease, or a problem with your blood circulation. It also can be a side effect of certain medicines or because of an infection. It often happens to women during pregnancy. Sometimes, the cause is not known. Follow these instructions at home: Managing pain, stiffness, and swelling   Raise (elevate) your legs while you are sitting or lying down.  Move around often to prevent stiffness and to lessen swelling.  Do not sit or stand for long periods of time.  Wear support stockings as told by your health care provider. Medicines  Take over-the-counter and prescription medicines only as told by your health care provider.  Your health care provider may prescribe medicine to help your body get rid of excess water (diuretic). General instructions  Pay attention to any changes in your symptoms.  Follow instructions from your health care provider about limiting salt (sodium) in your diet. Sometimes, eating less salt may reduce swelling.  Moisturize skin daily to help prevent skin from cracking and draining.  Keep all follow-up visits as told by your health care provider. This is important. Contact a health care provider if you have:  A fever.  Edema that starts suddenly or is getting worse, especially if you are pregnant or have a medical condition.  Swelling in only one leg.  Increased  swelling, redness, or pain in one or both of your legs.  Drainage or sores at the area where you have edema. Get help right away if you:  Develop shortness of breath, especially when you are lying down.  Have pain in your chest or abdomen.  Feel weak.  Feel faint. Summary  Peripheral edema is swelling that is caused by a buildup of fluid. Peripheral edema most often affects the lower legs, ankles, and feet.  Move around often to prevent stiffness and to lessen swelling. Do not sit or stand for long periods of time.  Pay attention to any changes in your symptoms.  Contact a health care provider if you have edema that starts suddenly or is getting worse, especially if you are pregnant or have a medical condition.  Get help right away if you develop shortness of breath, especially when lying down. This information is not intended to replace advice given to you by your health care provider. Make sure you discuss any questions you have with your health care provider. Document Revised: 12/06/2017 Document Reviewed: 12/06/2017 Elsevier Patient Education  2020 [employer].  Urinary Frequency, Adult Urinary frequency means urinating more often than usual. You may urinate every 1-2 hours even though you drink a normal amount of fluid and do not have a bladder infection or condition. Although you urinate more often than normal, the total amount of urine produced in a day is normal. With urinary frequency, you may have an urgent need to urinate often. The stress and anxiety of  needing to find a bathroom quickly can make this urge worse. This condition may go away on its own or you may need treatment at home. Home treatment may include bladder training, exercises, taking medicines, or making changes to your diet. Follow these instructions at home: Bladder health   Keep a bladder diary if told by your health care provider. Keep track of: ? What you eat and drink. ? How often you  urinate. ? How much you urinate.  Follow a bladder training program if told by your health care provider. This may include: ? Learning to delay going to the bathroom. ? Double urinating (voiding). This helps if you are not completely emptying your bladder. ? Scheduled voiding.  Do Kegel exercises as told by your health care provider. Kegel exercises strengthen the muscles that help control urination, which may help the condition. Eating and drinking  If told by your health care provider, make diet changes, such as: ? Avoiding caffeine. ? Drinking fewer fluids, especially alcohol. ? Not drinking in the evening. ? Avoiding foods or drinks that may irritate the bladder. These include coffee, tea, soda, artificial sweeteners, citrus, tomato-based foods, and chocolate. ? Eating foods that help prevent or ease constipation. Constipation can make this condition worse. Your health care provider may recommend that you:  Drink enough fluid to keep your urine pale yellow.  Take over-the-counter or prescription medicines.  Eat foods that are high in fiber, such as beans, whole grains, and fresh fruits and vegetables.  Limit foods that are high in fat and processed sugars, such as fried or sweet foods. General instructions  Take over-the-counter and prescription medicines only as told by your health care provider.  Keep all follow-up visits as told by your health care provider. This is important. Contact a health care provider if:  You start urinating more often.  You feel pain or irritation when you urinate.  You notice blood in your urine.  Your urine looks cloudy.  You develop a fever.  You begin vomiting. Get help right away if:  You are unable to urinate. Summary  Urinary frequency means urinating more often than usual. With urinary frequency, you may urinate every 1-2 hours even though you drink a normal amount of fluid and do not have a bladder infection or other bladder  condition.  Your health care provider may recommend that you keep a bladder diary, follow a bladder training program, or make dietary changes.  If told by your health care provider, do Kegel exercises to strengthen the muscles that help control urination.  Take over-the-counter and prescription medicines only as told by your health care provider.  Contact a health care provider if your symptoms do not improve or get worse. This information is not intended to replace advice given to you by your health care provider. Make sure you discuss any questions you have with your health care provider. Document Revised: 09/21/2017 Document Reviewed: 09/21/2017 Elsevier Patient Education  2020 [employer].

## 2020-01-03 LAB — TSH: TSH: 0.83 mIU/L

## 2020-01-03 LAB — BRAIN NATRIURETIC PEPTIDE: Brain Natriuretic Peptide: 17 pg/mL (ref <100)

## 2020-01-03 LAB — COMPLETE METABOLIC PANEL WITH GFR
AG Ratio: 1.3 (calc) (ref 1.0–2.5)
ALT: 12 U/L (ref 6–29)
AST: 19 U/L (ref 10–30)
Albumin: 4 g/dL (ref 3.6–5.1)
Alkaline phosphatase (APISO): 38 U/L (ref 31–125)
BUN: 8 mg/dL (ref 7–25)
CO2: 23 mmol/L (ref 20–32)
Calcium: 8.9 mg/dL (ref 8.6–10.2)
Chloride: 104 mmol/L (ref 98–110)
Creat: 0.78 mg/dL (ref 0.50–1.10)
GFR, Est African American: 109 mL/min/{1.73_m2} (ref >=60)
GFR, Est Non African American: 94 mL/min/{1.73_m2} (ref >=60)
Globulin: 3.2 g/dL (calc) (ref 1.9–3.7)
Glucose, Bld: 84 mg/dL (ref 65–99)
Potassium: 4.6 mmol/L (ref 3.5–5.3)
Sodium: 136 mmol/L (ref 135–146)
Total Bilirubin: 0.4 mg/dL (ref 0.2–1.2)
Total Protein: 7.2 g/dL (ref 6.1–8.1)

## 2020-01-03 LAB — T4, FREE: Free T4: 1.1 ng/dL (ref 0.8–1.8)

## 2020-01-17 ENCOUNTER — Telehealth: Payer: Self-pay

## 2020-01-17 NOTE — Telephone Encounter (Signed)
Received PA for Saxenda, Can you verify if she is still taking? If so then I'll proceed.

## 2020-01-21 MED ORDER — SAXENDA 18 MG/3ML ~~LOC~~ SOPN: 3.0000 mg | PEN_INJECTOR | Freq: Every day | SUBCUTANEOUS | 3 refills | Status: DC

## 2020-01-21 NOTE — Telephone Encounter (Signed)
Patient called    Patient states she is till taking medication and would like it sent to pharmacy

## 2020-01-21 NOTE — Telephone Encounter (Signed)
PA initiated via Covermymeds; KEY: [id number]. PA cancelled by plan.   Weight loss treatments, including but not limited to medications, diet supplements, and surgeries outside the scope of the [facility] program, are not covered. Please contact [employer] at [phone] for further assistance.

## 2020-01-23 ENCOUNTER — Ambulatory Visit: Payer: Medicaid Other | Admitting: Family Medicine

## 2020-03-02 ENCOUNTER — Encounter: Payer: Self-pay | Admitting: Family Medicine

## 2020-03-02 ENCOUNTER — Other Ambulatory Visit: Payer: Self-pay

## 2020-03-02 ENCOUNTER — Ambulatory Visit (INDEPENDENT_AMBULATORY_CARE_PROVIDER_SITE_OTHER): Payer: Medicaid Other | Admitting: Family Medicine

## 2020-03-02 NOTE — Patient Instructions (Signed)

## 2020-03-02 NOTE — Assessment & Plan Note (Signed)
Pt will call ins co  And let us know what else can be done

## 2020-03-02 NOTE — Progress Notes (Signed)
Patient ID: Veronica Stanton, female    DOB: Nov 02, 1980  Age: 39 y.o. MRN: [id number]    Subjective:  Subjective  HPI Veronica Stanton presents for f/u weight loss.  She was doing well with saxenda but her ins will not pay for it   Review of Systems  Constitutional: Negative for appetite change, diaphoresis, fatigue and unexpected weight change.  Eyes: Negative for pain, redness and visual disturbance.  Respiratory: Negative for cough, chest tightness, shortness of breath and wheezing.   Cardiovascular: Negative for chest pain, palpitations and leg swelling.  Endocrine: Negative for cold intolerance, heat intolerance, polydipsia, polyphagia and polyuria.  Genitourinary: Negative for difficulty urinating, dysuria and frequency.  Neurological: Negative for dizziness, light-headedness, numbness and headaches.    History Past Medical History:  Diagnosis Date  . No diagnosis   . Palpitations     She has a past surgical history that includes negative.   Her family history is not on file.She reports that she has never smoked. She has never used smokeless tobacco. She reports that she does not drink alcohol and does not use drugs.  Current Outpatient Medications on File Prior to Visit  Medication Sig Dispense Refill  . Insulin Pen Needle (NOVOFINE) 32G X 6 MM MISC 1 Dose by Does not apply route daily. 100 each 2  . Liraglutide -Weight Management (SAXENDA) 18 MG/3ML SOPN Inject 3 mg into the skin daily. 15 mL 3  . topiramate (TOPAMAX) 25 MG tablet Take 3 tablets daily 270 tablet 3   No current facility-administered medications on file prior to visit.     Objective:  Objective  Physical Exam Vitals and nursing note reviewed.  Constitutional:      Appearance: She is well-developed.  HENT:     Head: Normocephalic and atraumatic.  Eyes:     Conjunctiva/sclera: Conjunctivae normal.  Neck:     Thyroid: No thyromegaly.     Vascular: No carotid bruit or JVD.  Cardiovascular:      Rate and Rhythm: Normal rate and regular rhythm.     Heart sounds: Normal heart sounds. No murmur heard.   Pulmonary:     Effort: Pulmonary effort is normal. No respiratory distress.     Breath sounds: Normal breath sounds. No wheezing or rales.  Chest:     Chest wall: No tenderness.  Musculoskeletal:     Cervical back: Normal range of motion and neck supple.  Neurological:     Mental Status: She is alert and oriented to person, place, and time.    BP 116/82 (BP Location: Right Arm, Patient Position: Sitting, Cuff Size: Large)   Pulse 96   Temp 97.9 F (36.6 C) (Oral)   Resp 18   Ht 5\' 7"  (1.702 m)   Wt 191 lb 12.8 oz (87 kg)   SpO2 96%   BMI 30.04 kg/m  Wt Readings from Last 3 Encounters:  03/02/20 191 lb 12.8 oz (87 kg)  01/02/20 188 lb 6.4 oz (85.5 kg)  10/21/19 197 lb 6.4 oz (89.5 kg)     Lab Results  Component Value Date   WBC 7.7 09/24/2019   HGB 12.2 09/24/2019   HCT 36.5 09/24/2019   PLT 260.0 09/24/2019   GLUCOSE 84 01/02/2020   CHOL 180 09/24/2019   TRIG 54.0 09/24/2019   HDL 50.30 09/24/2019   LDLCALC 119 (H) 09/24/2019   ALT 12 01/02/2020   AST 19 01/02/2020   NA 136 01/02/2020   K 4.6 01/02/2020   CL  104 01/02/2020   CREATININE 0.78 01/02/2020   BUN 8 01/02/2020   CO2 23 01/02/2020   TSH 0.83 01/02/2020   HGBA1C 5.0 01/02/2020    No results found.   Assessment & Plan:  Plan  I am having Veronica Stanton maintain her topiramate, NovoFine, and Saxenda.  No orders of the defined types were placed in this encounter.   Problem List Items Addressed This Visit    None      Follow-up: Return if symptoms worsen or fail to improve, for --.  Donato Schultz, DO

## 2020-03-17 ENCOUNTER — Telehealth (INDEPENDENT_AMBULATORY_CARE_PROVIDER_SITE_OTHER): Payer: Self-pay | Admitting: Family Medicine

## 2020-03-17 ENCOUNTER — Encounter: Payer: Self-pay | Admitting: Family Medicine

## 2020-03-17 ENCOUNTER — Other Ambulatory Visit: Payer: Self-pay

## 2020-03-17 VITALS — Ht 67.0 in

## 2020-03-17 DIAGNOSIS — J069 Acute upper respiratory infection, unspecified: Secondary | ICD-10-CM

## 2020-03-17 MED ORDER — FLUTICASONE PROPIONATE 50 MCG/ACT NA SUSP: 2.0000 | Freq: Every day | NASAL | 6 refills | Status: DC

## 2020-03-17 MED ORDER — LORATADINE 10 MG PO TABS: 10.0000 mg | ORAL_TABLET | Freq: Every day | ORAL | 11 refills | Status: DC

## 2020-03-17 NOTE — Progress Notes (Signed)
Virtual Visit via Video Note  I connected with Veronica Stanton on 03/17/20 at  4:20 PM EST by a video enabled telemedicine application and verified that I am speaking with the correct person using two identifiers.  Location: Patient: home alone  Provider: office    I discussed the limitations of evaluation and management by telemedicine and the availability of in person appointments. The patient expressed understanding and agreed to proceed.  History of Present Illness: Pt is home c/o fever 101, no taste, smell, + headache  + sinus congestion , low energy -- symptoms started last Thursday   Observations/Objective: There were no vitals filed for this visit. Fever 101  Pt is in nad   Assessment and Plan: 1. Viral upper respiratory tract infection covid test to be done-- pt will quarantine  Flonase/ claritin  F/u when covid results are back  - fluticasone (FLONASE) 50 MCG/ACT nasal spray; Place 2 sprays into both nostrils daily.  Dispense: 16 g; Refill: 6 - loratadine (CLARITIN) 10 MG tablet; Take 1 tablet (10 mg total) by mouth daily.  Dispense: 30 tablet; Refill: 11   Follow Up Instructions:    I discussed the assessment and treatment plan with the patient. The patient was provided an opportunity to ask questions and all were answered. The patient agreed with the plan and demonstrated an understanding of the instructions.   The patient was advised to call back or seek an in-person evaluation if the symptoms worsen or if the condition fails to improve as anticipated.  I provided 25 minutes of non-face-to-face time during this encounter.   Donato Schultz, DO ;

## 2020-03-30 ENCOUNTER — Telehealth (INDEPENDENT_AMBULATORY_CARE_PROVIDER_SITE_OTHER): Payer: BC Managed Care – PPO | Admitting: Medical

## 2020-03-30 ENCOUNTER — Other Ambulatory Visit: Payer: Self-pay

## 2020-03-30 DIAGNOSIS — R059 Cough, unspecified: Secondary | ICD-10-CM

## 2020-03-30 NOTE — Patient Instructions (Signed)
History of uri type symptoms 2 weeks ago with negative covid test past Tuesday. Patient has be asymptomatic for 5 or more days. Covid test negative. She needs note to return to work.  I sent note to my chart. She can print and give to employer.  Follow up as regularly scheduled with pcp or as needed

## 2020-03-30 NOTE — Progress Notes (Signed)
   Subjective:    Patient ID: Veronica Stanton, female    DOB: June 04, 1980, 40 y.o.   MRN: [id number]  HPI  Virtual Visit via Video Note  I connected with Veronica Stanton on 03/30/20 at  2:40 PM EST by a video enabled telemedicine application and verified that I am speaking with the correct person using two identifiers.  Location: Patient: car Provider: office   I discussed the limitations of evaluation and management by telemedicine and the availability of in person appointments. The patient expressed understanding and agreed to proceed.  History of Present Illness:  Pt in for follow up.   Pt had covid symptoms 2 weeks ago. Had st, cough and runny nose. Pt got covid past last Tuesday and her test came back on Tuesday about 2 hours later. Thought she saw on thursday. Test was negative. Pt had test at [facility].   Pt has asymptomatic now for 5 days or more.  Pt states has had covid vaccines.     Observations/Objective: General- no acute distress.   Assessment and Plan: History of uri type symptoms 2 weeks ago with negative covid test past Tuesday. Patient has be asymptomatic for 5 or more days. Covid test negative. She needs note to return to work.  I sent note to my chart. She can print and give to employer.  Follow up as regularly scheduled with pcp or as needed  Time spent with patient today was  15 minutes which consisted of chart revdiew, discussing diagnosis, work up treatment and documentation. Follow Up Instructions:    I discussed the assessment and treatment plan with the patient. The patient was provided an opportunity to ask questions and all were answered. The patient agreed with the plan and demonstrated an understanding of the instructions.   The patient was advised to call back or seek an in-person evaluation if the symptoms worsen or if the condition fails to improve as anticipated.     Esperanza Richters, PA-C   Review of Systems  Constitutional: Negative  for chills, fatigue and fever.       No symptoms for more than 5 days.  HENT: Negative for congestion and ear pain.   Respiratory: Negative for cough, chest tightness, shortness of breath and wheezing.   Cardiovascular: Negative for chest pain and palpitations.  Musculoskeletal: Negative for back pain and myalgias.  Skin: Negative for rash.       Objective:   Physical Exam        Assessment & Plan:

## 2020-06-15 ENCOUNTER — Other Ambulatory Visit: Payer: Self-pay

## 2020-06-15 ENCOUNTER — Encounter: Payer: Self-pay | Admitting: Family Medicine

## 2020-06-15 ENCOUNTER — Ambulatory Visit (INDEPENDENT_AMBULATORY_CARE_PROVIDER_SITE_OTHER): Payer: BC Managed Care – PPO | Admitting: Family Medicine

## 2020-06-15 VITALS — BP 120/88 | HR 68 | Temp 98.2°F | Resp 18 | Ht 67.0 in | Wt 197.6 lb

## 2020-06-15 DIAGNOSIS — G40009 Localization-related (focal) (partial) idiopathic epilepsy and epileptic syndromes with seizures of localized onset, not intractable, without status epilepticus: Secondary | ICD-10-CM

## 2020-06-15 DIAGNOSIS — R600 Localized edema: Secondary | ICD-10-CM

## 2020-06-15 DIAGNOSIS — I479 Paroxysmal tachycardia, unspecified: Secondary | ICD-10-CM

## 2020-06-15 MED ORDER — HYDROCHLOROTHIAZIDE 25 MG PO TABS: 25.0000 mg | ORAL_TABLET | Freq: Every day | ORAL | 3 refills | Status: DC

## 2020-06-15 NOTE — Progress Notes (Signed)
Patient ID: Veronica Stanton, female    DOB: 06/19/1980  Age: 40 y.o. MRN: [id number]    Subjective:  Subjective  HPI Veronica Stanton presents for an office visit today. She complains of weight gain due to her diet. She notes that she is unable to attend health and wellness program due to her busy work schedule. However, she has been taking 18 mg/ 28ml of Saxenda subcutaneous daily which originally resulted in weight loss, but she has regain her lost weight.  Wt Readings from Last 3 Encounters:  06/15/20 197 lb 9.6 oz (89.6 kg)  03/02/20 191 lb 12.8 oz (87 kg)  01/02/20 188 lb 6.4 oz (85.5 kg)   She also complains of bilateral LE swelling and pain. However, she reports experiencing a single episode of left ankle swelling and pain that occurred this weekend(3/19-20). To treat the symptoms the patient wore a compression sock but only mild relief. She also endorses having left knee stiffness occasional. She denies having any trama to her bilateral LE. She denies any chest pain, SOB, fever, abdominal pain, cough, chills, sore throat, dysuria, urinary incontinence, back pain, HA, or N/VD at this time.  She reports her father has passed away from CHF and multi organ failure.   Review of Systems  Constitutional: Negative for chills, fatigue and fever.       (+) gain weight    HENT: Negative for congestion, sinus pain and sore throat.   Eyes: Negative for pain.  Respiratory: Negative for cough and shortness of breath.   Cardiovascular: Negative for chest pain, palpitations and leg swelling.  Gastrointestinal: Negative for abdominal pain, blood in stool, constipation, diarrhea, nausea and vomiting.  Genitourinary: Negative for dysuria, frequency, hematuria and urgency.  Musculoskeletal: Negative for back pain and myalgias.       (+) bilateral LE swelling and pain(L>R) (+) Left knee stiffness   Skin: Negative for rash.  Neurological: Negative for headaches.    History Past Medical History:   Diagnosis Date  . No diagnosis   . Palpitations     She has a past surgical history that includes negative.   Her family history includes AAA (abdominal aortic aneurysm) in her paternal aunt; Congestive Heart Failure in her father; Diabetes Mellitus II in her paternal aunt; Kidney failure in her father.She reports that she has never smoked. She has never used smokeless tobacco. She reports that she does not drink alcohol and does not use drugs.  Current Outpatient Medications on File Prior to Visit  Medication Sig Dispense Refill  . fluticasone (FLONASE) 50 MCG/ACT nasal spray Place 2 sprays into both nostrils daily. 16 g 6  . Insulin Pen Needle (NOVOFINE) 32G X 6 MM MISC 1 Dose by Does not apply route daily. 100 each 2  . Liraglutide -Weight Management (SAXENDA) 18 MG/3ML SOPN Inject 3 mg into the skin daily. 15 mL 3  . loratadine (CLARITIN) 10 MG tablet Take 1 tablet (10 mg total) by mouth daily. 30 tablet 11  . topiramate (TOPAMAX) 25 MG tablet Take 3 tablets daily 270 tablet 3   No current facility-administered medications on file prior to visit.     Objective:  Objective  Physical Exam Constitutional:      General: She is not in acute distress.    Appearance: Normal appearance. She is well-developed. She is not ill-appearing.  HENT:     Head: Normocephalic.     Right Ear: External ear normal.     Left Ear: External ear normal.  Nose: Nose normal.  Eyes:     Extraocular Movements: Extraocular movements intact.     Pupils: Pupils are equal, round, and reactive to light.  Cardiovascular:     Rate and Rhythm: Normal rate and regular rhythm.     Pulses: Normal pulses.     Heart sounds: Normal heart sounds. No murmur heard.   Pulmonary:     Effort: Pulmonary effort is normal. No respiratory distress.     Breath sounds: Normal breath sounds. No wheezing or rales.  Chest:     Chest wall: No tenderness.  Abdominal:     General: Bowel sounds are normal.     Tenderness:  There is no abdominal tenderness. There is no guarding.     Hernia: No hernia is present.  Musculoskeletal:     Cervical back: Normal range of motion and neck supple.  Neurological:     Mental Status: She is alert and oriented to person, place, and time.  Psychiatric:        Behavior: Behavior normal.    BP 120/88 (BP Location: Right Arm, Patient Position: Sitting, Cuff Size: Normal)   Pulse 68   Temp 98.2 F (36.8 C) (Oral)   Resp 18   Ht 5\' 7"  (1.702 m)   Wt 197 lb 9.6 oz (89.6 kg)   SpO2 99%   BMI 30.95 kg/m  Wt Readings from Last 3 Encounters:  06/15/20 197 lb 9.6 oz (89.6 kg)  03/02/20 191 lb 12.8 oz (87 kg)  01/02/20 188 lb 6.4 oz (85.5 kg)     Lab Results  Component Value Date   WBC 7.7 09/24/2019   HGB 12.2 09/24/2019   HCT 36.5 09/24/2019   PLT 260.0 09/24/2019   GLUCOSE 84 01/02/2020   CHOL 180 09/24/2019   TRIG 54.0 09/24/2019   HDL 50.30 09/24/2019   LDLCALC 119 (H) 09/24/2019   ALT 12 01/02/2020   AST 19 01/02/2020   NA 136 01/02/2020   K 4.6 01/02/2020   CL 104 01/02/2020   CREATININE 0.78 01/02/2020   BUN 8 01/02/2020   CO2 23 01/02/2020   TSH 0.83 01/02/2020   HGBA1C 5.0 01/02/2020    No results found.   Assessment & Plan:  Plan    Meds ordered this encounter  Medications  . hydrochlorothiazide (HYDRODIURIL) 25 MG tablet    Sig: Take 1 tablet (25 mg total) by mouth daily.    Dispense:  90 tablet    Refill:  3    Problem List Items Addressed This Visit      Unprioritized   Localization-related idiopathic epilepsy and epileptic syndromes with seizures of localized onset, not intractable, without status epilepticus (HCC)   Lower extremity edema - Primary    Compression socke hctz daily  F/u 2 weeks or sooner prn  Eat/ drink foods that are potassium rich --- oj, banana      Relevant Medications   hydrochlorothiazide (HYDRODIURIL) 25 MG tablet   Morbid obesity (HCC)    Per ins will not pay for weight loss meds We have no  samples  Again recc [facility] but pt unable to go due to schedule-- she did not like [employer] or [employer]      Paroxysmal tachycardia (HCC)   Relevant Medications   hydrochlorothiazide (HYDRODIURIL) 25 MG tablet      Follow-up: Return in about 2 weeks (around 06/29/2020), or if symptoms worsen or fail to improve, for edema.   I,Gordon Zheng,acting as a 08/29/2020 for Neurosurgeon  R Zola Button, DO.,have documented all relevant documentation on the behalf of Donato Schultz, DO,as directed by  Donato Schultz, DO while in the presence of Donato Schultz, DO.  I, Donato Schultz, DO, have reviewed all documentation for this visit. The documentation on 06/15/20 for the exam, diagnosis, procedures, and orders are all accurate and complete.

## 2020-06-15 NOTE — Patient Instructions (Signed)

## 2020-06-15 NOTE — Assessment & Plan Note (Signed)
Per ins will not pay for weight loss meds We have no samples  Again recc [facility] but pt unable to go due to schedule-- she did not like [employer] or [employer]

## 2020-06-15 NOTE — Assessment & Plan Note (Signed)
Compression socke hctz daily  F/u 2 weeks or sooner prn  Eat/ drink foods that are potassium rich --- oj, banana

## 2020-06-26 ENCOUNTER — Ambulatory Visit: Payer: BC Managed Care – PPO | Admitting: Family Medicine

## 2020-08-10 ENCOUNTER — Encounter: Payer: Self-pay | Admitting: Family Medicine

## 2020-08-10 ENCOUNTER — Ambulatory Visit (INDEPENDENT_AMBULATORY_CARE_PROVIDER_SITE_OTHER): Payer: BC Managed Care – PPO | Admitting: Family Medicine

## 2020-08-10 ENCOUNTER — Other Ambulatory Visit: Payer: Self-pay

## 2020-08-10 MED ORDER — SAXENDA 18 MG/3ML ~~LOC~~ SOPN: 3.0000 mg | PEN_INJECTOR | Freq: Every day | SUBCUTANEOUS | 3 refills | Status: DC

## 2020-08-10 MED ORDER — NOVOFINE PEN NEEDLE 32G X 6 MM MISC: 1 refills | Status: DC

## 2020-08-10 MED ORDER — SAXENDA 18 MG/3ML ~~LOC~~ SOPN: PEN_INJECTOR | SUBCUTANEOUS | 3 refills | Status: DC

## 2020-08-10 NOTE — Progress Notes (Signed)
Patient ID: Veronica Stanton, female    DOB: Dec 06, 1980  Age: 40 y.o. MRN: [id number]    Subjective:  Subjective  HPI Veronica Stanton presents for an office visit today. She reports feeling well. She is requesting medication for weight lost. She denies any chest pain, SOB, fever, abdominal pain, cough, chills, sore throat, dysuria, urinary incontinence, back pain, HA, or N/V/D at this time.  Her ins would not cover the saxenda but she has not tried the coupon from the company  Review of Systems  Constitutional: Negative for chills, fatigue and fever.  HENT: Negative for ear pain, rhinorrhea, sinus pressure, sinus pain, sore throat and tinnitus.   Eyes: Negative for pain.  Respiratory: Negative for cough, shortness of breath and wheezing.   Cardiovascular: Negative for chest pain.  Gastrointestinal: Negative for abdominal pain, anal bleeding, constipation, diarrhea, nausea and vomiting.  Genitourinary: Negative for flank pain.  Musculoskeletal: Negative for back pain and neck pain.  Skin: Negative for rash.  Neurological: Negative for seizures, weakness, light-headedness, numbness and headaches.    History Past Medical History:  Diagnosis Date  . No diagnosis   . Palpitations     She has a past surgical history that includes negative.   Her family history includes AAA (abdominal aortic aneurysm) in her paternal aunt; Congestive Heart Failure in her father; Diabetes Mellitus II in her paternal aunt; Kidney failure in her father.She reports that she has never smoked. She has never used smokeless tobacco. She reports that she does not drink alcohol and does not use drugs.  Current Outpatient Medications on File Prior to Visit  Medication Sig Dispense Refill  . fluticasone (FLONASE) 50 MCG/ACT nasal spray Place 2 sprays into both nostrils daily. 16 g 6  . hydrochlorothiazide (HYDRODIURIL) 25 MG tablet Take 1 tablet (25 mg total) by mouth daily. 90 tablet 3  . Insulin Pen Needle  (NOVOFINE) 32G X 6 MM MISC 1 Dose by Does not apply route daily. 100 each 2  . loratadine (CLARITIN) 10 MG tablet Take 1 tablet (10 mg total) by mouth daily. 30 tablet 11  . topiramate (TOPAMAX) 25 MG tablet Take 3 tablets daily 270 tablet 3   No current facility-administered medications on file prior to visit.     Objective:  Objective  Physical Exam Vitals and nursing note reviewed.  Constitutional:      General: She is not in acute distress.    Appearance: Normal appearance. She is well-developed. She is obese. She is not ill-appearing.  HENT:     Head: Normocephalic and atraumatic.     Right Ear: External ear normal.     Left Ear: External ear normal.     Nose: Nose normal.  Eyes:     General:        Right eye: No discharge.        Left eye: No discharge.     Extraocular Movements: Extraocular movements intact.     Pupils: Pupils are equal, round, and reactive to light.  Cardiovascular:     Rate and Rhythm: Normal rate and regular rhythm.     Pulses: Normal pulses.     Heart sounds: Normal heart sounds. No murmur heard. No friction rub. No gallop.   Pulmonary:     Effort: Pulmonary effort is normal. No respiratory distress.     Breath sounds: Normal breath sounds. No stridor. No wheezing, rhonchi or rales.  Chest:     Chest wall: No tenderness.  Abdominal:  General: Bowel sounds are normal. There is no distension.     Palpations: Abdomen is soft. There is no mass.     Tenderness: There is no abdominal tenderness. There is no guarding or rebound.     Hernia: No hernia is present.  Musculoskeletal:        General: Normal range of motion.     Cervical back: Normal range of motion and neck supple.     Right lower leg: No edema.     Left lower leg: No edema.  Skin:    General: Skin is warm and dry.  Neurological:     Mental Status: She is alert and oriented to person, place, and time.  Psychiatric:        Behavior: Behavior normal.        Thought Content: Thought  content normal.    BP 104/84 (BP Location: Right Arm, Patient Position: Sitting, Cuff Size: Normal)   Pulse 80   Temp 98.5 F (36.9 C) (Oral)   Resp 18   Ht 5\' 7"  (1.702 m)   Wt 188 lb 3.2 oz (85.4 kg)   SpO2 98%   BMI 29.48 kg/m  Wt Readings from Last 3 Encounters:  08/10/20 188 lb 3.2 oz (85.4 kg)  06/15/20 197 lb 9.6 oz (89.6 kg)  03/02/20 191 lb 12.8 oz (87 kg)     Lab Results  Component Value Date   WBC 7.7 09/24/2019   HGB 12.2 09/24/2019   HCT 36.5 09/24/2019   PLT 260.0 09/24/2019   GLUCOSE 84 01/02/2020   CHOL 180 09/24/2019   TRIG 54.0 09/24/2019   HDL 50.30 09/24/2019   LDLCALC 119 (H) 09/24/2019   ALT 12 01/02/2020   AST 19 01/02/2020   NA 136 01/02/2020   K 4.6 01/02/2020   CL 104 01/02/2020   CREATININE 0.78 01/02/2020   BUN 8 01/02/2020   CO2 23 01/02/2020   TSH 0.83 01/02/2020   HGBA1C 5.0 01/02/2020    No results found.   Assessment & Plan:  Plan    Meds ordered this encounter  Medications  . DISCONTD: Liraglutide -Weight Management (SAXENDA) 18 MG/3ML SOPN    Sig: Inject 3 mg into the skin daily.    Dispense:  15 mL    Refill:  3  . DISCONTD: Insulin Pen Needle (NOVOFINE PEN NEEDLE) 32G X 6 MM MISC    Sig: As directed    Dispense:  100 each    Refill:  1  . DISCONTD: Liraglutide -Weight Management (SAXENDA) 18 MG/3ML SOPN    Sig: Inject 3 mg into the skin daily.    Dispense:  15 mL    Refill:  3  . Liraglutide -Weight Management (SAXENDA) 18 MG/3ML SOPN    Sig: 3 mg sq qd    Dispense:  15 mL    Refill:  3  . DISCONTD: Insulin Pen Needle (NOVOFINE PEN NEEDLE) 32G X 6 MM MISC    Sig: As directed    Dispense:  100 each    Refill:  1  . Liraglutide -Weight Management (SAXENDA) 18 MG/3ML SOPN    Sig: Inject 3 mg into the skin daily.    Dispense:  15 mL    Refill:  3  . Insulin Pen Needle (NOVOFINE PEN NEEDLE) 32G X 6 MM MISC    Sig: As directed    Dispense:  100 each    Refill:  1    Problem List Items Addressed This Visit  Unprioritized   Morbid obesity (HCC)   Relevant Medications   Liraglutide -Weight Management (SAXENDA) 18 MG/3ML SOPN   Liraglutide -Weight Management (SAXENDA) 18 MG/3ML SOPN    discount coupon printed from company  F/u 3 months or sooner prn   Follow-up: Return in about 3 months (around 11/10/2020), or if symptoms worsen or fail to improve.   I,Gordon Zheng,acting as a Neurosurgeon for [employer], DO.,have documented all relevant documentation on the behalf of Donato Schultz, DO,as directed by  Donato Schultz, DO while in the presence of Donato Schultz, DO.  I, Donato Schultz, DO, have reviewed all documentation for this visit. The documentation on 08/10/20 for the exam, diagnosis, procedures, and orders are all accurate and complete. Donato Schultz, DO

## 2020-08-10 NOTE — Patient Instructions (Signed)
Calorie Counting for Weight Loss Calories are units of energy. Your body needs a certain number of calories from food to keep going throughout the day. When you eat or drink more calories than your body needs, your body stores the extra calories mostly as fat. When you eat or drink fewer calories than your body needs, your body burns fat to get the energy it needs. Calorie counting means keeping track of how many calories you eat and drink each day. Calorie counting can be helpful if you need to lose weight. If you eat fewer calories than your body needs, you should lose weight. Ask your health care provider what a healthy weight is for you. For calorie counting to work, you will need to eat the right number of calories each day to lose a healthy amount of weight per week. A dietitian can help you figure out how many calories you need in a day and will suggest ways to reach your calorie goal.  A healthy amount of weight to lose each week is usually 1-2 lb (0.5-0.9 kg). This usually means that your daily calorie intake should be reduced by 500-750 calories.  Eating 1,200-1,500 calories a day can help most women lose weight.  Eating 1,500-1,800 calories a day can help most men lose weight. What do I need to know about calorie counting? Work with your health care provider or dietitian to determine how many calories you should get each day. To meet your daily calorie goal, you will need to:  Find out how many calories are in each food that you would like to eat. Try to do this before you eat.  Decide how much of the food you plan to eat.  Keep a food log. Do this by writing down what you ate and how many calories it had. To successfully lose weight, it is important to balance calorie counting with a healthy lifestyle that includes regular activity. Where do I find calorie information? The number of calories in a food can be found on a Nutrition Facts label. If a food does not have a Nutrition Facts  label, try to look up the calories online or ask your dietitian for help. Remember that calories are listed per serving. If you choose to have more than one serving of a food, you will have to multiply the calories per serving by the number of servings you plan to eat. For example, the label on a package of bread might say that a serving size is 1 slice and that there are 90 calories in a serving. If you eat 1 slice, you will have eaten 90 calories. If you eat 2 slices, you will have eaten 180 calories.   How do I keep a food log? After each time that you eat, record the following in your food log as soon as possible:  What you ate. Be sure to include toppings, sauces, and other extras on the food.  How much you ate. This can be measured in cups, ounces, or number of items.  How many calories were in each food and drink.  The total number of calories in the food you ate. Keep your food log near you, such as in a pocket-sized notebook or on an app or website on your mobile phone. Some programs will calculate calories for you and show you how many calories you have left to meet your daily goal. What are some portion-control tips?  Know how many calories are in a serving. This will   help you know how many servings you can have of a certain food.  Use a measuring cup to measure serving sizes. You could also try weighing out portions on a kitchen scale. With time, you will be able to estimate serving sizes for some foods.  Take time to put servings of different foods on your favorite plates or in your favorite bowls and cups so you know what a serving looks like.  Try not to eat straight from a food's packaging, such as from a bag or box. Eating straight from the package makes it hard to see how much you are eating and can lead to overeating. Put the amount you would like to eat in a cup or on a plate to make sure you are eating the right portion.  Use smaller plates, glasses, and bowls for smaller  portions and to prevent overeating.  Try not to multitask. For example, avoid watching TV or using your computer while eating. If it is time to eat, sit down at a table and enjoy your food. This will help you recognize when you are full. It will also help you be more mindful of what and how much you are eating. What are tips for following this plan? Reading food labels  Check the calorie count compared with the serving size. The serving size may be smaller than what you are used to eating.  Check the source of the calories. Try to choose foods that are high in protein, fiber, and vitamins, and low in saturated fat, trans fat, and sodium. Shopping  Read nutrition labels while you shop. This will help you make healthy decisions about which foods to buy.  Pay attention to nutrition labels for low-fat or fat-free foods. These foods sometimes have the same number of calories or more calories than the full-fat versions. They also often have added sugar, starch, or salt to make up for flavor that was removed with the fat.  Make a grocery list of lower-calorie foods and stick to it. Cooking  Try to cook your favorite foods in a healthier way. For example, try baking instead of frying.  Use low-fat dairy products. Meal planning  Use more fruits and vegetables. One-half of your plate should be fruits and vegetables.  Include lean proteins, such as chicken, turkey, and fish. Lifestyle Each week, aim to do one of the following:  150 minutes of moderate exercise, such as walking.  75 minutes of vigorous exercise, such as running. General information  Know how many calories are in the foods you eat most often. This will help you calculate calorie counts faster.  Find a way of tracking calories that works for you. Get creative. Try different apps or programs if writing down calories does not work for you. What foods should I eat?  Eat nutritious foods. It is better to have a nutritious,  high-calorie food, such as an avocado, than a food with few nutrients, such as a bag of potato chips.  Use your calories on foods and drinks that will fill you up and will not leave you hungry soon after eating. ? Examples of foods that fill you up are nuts and nut butters, vegetables, lean proteins, and high-fiber foods such as whole grains. High-fiber foods are foods with more than 5 g of fiber per serving.  Pay attention to calories in drinks. Low-calorie drinks include water and unsweetened drinks. The items listed above may not be a complete list of foods and beverages you can eat.   Contact a dietitian for more information.   What foods should I limit? Limit foods or drinks that are not good sources of vitamins, minerals, or protein or that are high in unhealthy fats. These include:  Candy.  Other sweets.  Sodas, specialty coffee drinks, alcohol, and juice. The items listed above may not be a complete list of foods and beverages you should avoid. Contact a dietitian for more information. How do I count calories when eating out?  Pay attention to portions. Often, portions are much larger when eating out. Try these tips to keep portions smaller: ? Consider sharing a meal instead of getting your own. ? If you get your own meal, eat only half of it. Before you start eating, ask for a container and put half of your meal into it. ? When available, consider ordering smaller portions from the menu instead of full portions.  Pay attention to your food and drink choices. Knowing the way food is cooked and what is included with the meal can help you eat fewer calories. ? If calories are listed on the menu, choose the lower-calorie options. ? Choose dishes that include vegetables, fruits, whole grains, low-fat dairy products, and lean proteins. ? Choose items that are boiled, broiled, grilled, or steamed. Avoid items that are buttered, battered, fried, or served with cream sauce. Items labeled as  crispy are usually fried, unless stated otherwise. ? Choose water, low-fat milk, unsweetened iced tea, or other drinks without added sugar. If you want an alcoholic beverage, choose a lower-calorie option, such as a glass of wine or light beer. ? Ask for dressings, sauces, and syrups on the side. These are usually high in calories, so you should limit the amount you eat. ? If you want a salad, choose a garden salad and ask for grilled meats. Avoid extra toppings such as bacon, cheese, or fried items. Ask for the dressing on the side, or ask for olive oil and vinegar or lemon to use as dressing.  Estimate how many servings of a food you are given. Knowing serving sizes will help you be aware of how much food you are eating at restaurants. Where to find more information  Centers for Disease Control and Prevention: www.cdc.gov  [place] Department of Agriculture: myplate.gov Summary  Calorie counting means keeping track of how many calories you eat and drink each day. If you eat fewer calories than your body needs, you should lose weight.  A healthy amount of weight to lose per week is usually 1-2 lb (0.5-0.9 kg). This usually means reducing your daily calorie intake by 500-750 calories.  The number of calories in a food can be found on a Nutrition Facts label. If a food does not have a Nutrition Facts label, try to look up the calories online or ask your dietitian for help.  Use smaller plates, glasses, and bowls for smaller portions and to prevent overeating.  Use your calories on foods and drinks that will fill you up and not leave you hungry shortly after a meal. This information is not intended to replace advice given to you by your health care provider. Make sure you discuss any questions you have with your health care provider. Document Revised: 04/25/2019 Document Reviewed: 04/25/2019 Elsevier Patient Education  2021 Elsevier Inc.  

## 2020-08-11 ENCOUNTER — Encounter: Payer: Self-pay | Admitting: Family Medicine

## 2020-08-11 ENCOUNTER — Telehealth: Payer: Self-pay

## 2020-08-11 NOTE — Telephone Encounter (Signed)
We gave her a coupon to use--- was she able to use it?

## 2020-08-11 NOTE — Telephone Encounter (Signed)
PA Request: Denied  (Key: [id number])  Weight loss treatments, including but not limited to medications, diet supplements, and surgeries outside the scope of the Centers of Excellence program, are not covered. Please contact [facility] [employer] at [phone] for further assistance.

## 2020-08-12 ENCOUNTER — Encounter: Payer: Self-pay | Admitting: Medical

## 2020-08-12 ENCOUNTER — Other Ambulatory Visit: Payer: Self-pay

## 2020-08-12 ENCOUNTER — Ambulatory Visit (INDEPENDENT_AMBULATORY_CARE_PROVIDER_SITE_OTHER): Payer: BC Managed Care – PPO | Admitting: Medical

## 2020-08-12 VITALS — BP 121/68 | HR 109 | Temp 98.3°F | Resp 16 | Wt 189.2 lb

## 2020-08-12 DIAGNOSIS — F419 Anxiety disorder, unspecified: Secondary | ICD-10-CM | POA: Diagnosis not present

## 2020-08-12 MED ORDER — CLONAZEPAM 0.5 MG PO TABS: 0.5000 mg | ORAL_TABLET | Freq: Two times a day (BID) | ORAL | 0 refills | Status: DC | PRN

## 2020-08-12 MED ORDER — VENLAFAXINE HCL ER 37.5 MG PO CP24: 37.5000 mg | ORAL_CAPSULE | Freq: Every day | ORAL | 0 refills | Status: DC

## 2020-08-12 NOTE — Patient Instructions (Addendum)
For recent anxiety from work events and panic attack, decided to go ahead and prescribe with clonazepam 0.5 mg 14 tablets to use 1 tablet twice daily as needed for severe anxiety or panic.  Try to use this sparingly.  Also went ahead and sent medication called Effexor to your pharmacy.  If you are finding that anxiety is low-level chronic daily and having to use clonazepam too much then go ahead and start low-dose Effexor.  Recent minimal sore throat after eating hot soup.  Symptoms are much improved.  Exam only shows minimal red pharynx.  No associated signs or symptoms.  You expressed some concern for COVID but I do not think you present with COVID type symptoms.  However if your symptoms do change or worsen then recommend doing over-the-counter COVID test.  Follow-up in 10 days or as needed.

## 2020-08-12 NOTE — Progress Notes (Signed)
Subjective:    Patient ID: Veronica Stanton, female    DOB: 09/22/1980, 40 y.o.   MRN: [id number]  HPI  Pt in for some recent stress/anxiety. Pt is Emergency planning/management officer. Pt states had limited resources to complete project despite lacking resources. Pt states had HR meeting. Issues since some of persons for project were not certified. However, pt was not given name of the person who were certified.    The person who withheld list of persons that needed to be certified contact HR thus she had meeting.  Pt states due to this does has developed trust issues at work. Today at meeting she describes having panic attack type symptoms.  Also she has to do training course for various persons. 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  HR initially refused to allow her to delay training course.  Pt states GM is in support of her(understanding)  Pt states could not sleep last night due to stress.  In the past pt did use benzo briefly in past.   Also yesterday she had st after she ate hot soup. Today felt better. Initial had concern for covid. No covid associated signs/symptoms.  Gad- 7 18. phq-9 15. States not depressed but mood has change abruptly along with high level anxiety.   Review of Systems  Constitutional: Negative for chills, fatigue and fever.  Respiratory: Negative for cough, chest tightness, shortness of breath and wheezing.   Cardiovascular: Negative for chest pain and palpitations.  Gastrointestinal: Negative for abdominal pain.  Musculoskeletal: Negative for back pain.  Skin: Negative for rash.  Neurological: Negative for dizziness, seizures, facial asymmetry and light-headedness.  Hematological: Negative for adenopathy. Does not bruise/bleed easily.  Psychiatric/Behavioral: Negative for agitation and behavioral problems. The patient is nervous/anxious.     Past Medical History:  Diagnosis Date  . No diagnosis   . Palpitations       Social History   Socioeconomic History  . Marital status: Single    Spouse name: Not on file  . Number of children: Not on file  . Years of education: Not on file  . Highest education level: Not on file  Occupational History  . Not on file  Tobacco Use  . Smoking status: Never Smoker  . Smokeless tobacco: Never Used  Vaping Use  . Vaping Use: Never used  Substance and Sexual Activity  . Alcohol use: No  . Drug use: No  . Sexual activity: Not on file  Other Topics Concern  . Not on file  Social History Narrative  . Not on file   Social Determinants of Health   Financial Resource Strain: Not on file  Food Insecurity: Not on file  Transportation Needs: Not on file  Physical Activity: Not on file  Stress: Not on file  Social Connections: Not on file  Intimate Partner Violence: Not on file    Past Surgical History:  Procedure Laterality Date  . negative      Family History  Problem Relation Age of Onset  . Congestive Heart Failure Father   . Kidney failure Father   . Diabetes Mellitus II Paternal Aunt   . AAA (abdominal aortic aneurysm) Paternal Aunt   . Stroke Neg Hx   . Heart disease Neg Hx   . Mental illness Neg Hx     No Known Allergies  Current Outpatient Medications on File Prior to Visit  Medication Sig Dispense Refill  . fluticasone (FLONASE) 50 MCG/ACT nasal  spray Place 2 sprays into both nostrils daily. 16 g 6  . hydrochlorothiazide (HYDRODIURIL) 25 MG tablet Take 1 tablet (25 mg total) by mouth daily. 90 tablet 3  . Insulin Pen Needle (NOVOFINE PEN NEEDLE) 32G X 6 MM MISC As directed 100 each 1  . Liraglutide -Weight Management (SAXENDA) 18 MG/3ML SOPN 3 mg sq qd 15 mL 3  . Liraglutide -Weight Management (SAXENDA) 18 MG/3ML SOPN Inject 3 mg into the skin daily. 15 mL 3  . loratadine (CLARITIN) 10 MG tablet Take 1 tablet (10 mg total) by mouth daily. 30 tablet 11  . topiramate (TOPAMAX) 25 MG tablet Take 3 tablets daily 270 tablet 3  . Insulin Pen  Needle (NOVOFINE) 32G X 6 MM MISC 1 Dose by Does not apply route daily. 100 each 2   No current facility-administered medications on file prior to visit.    BP 121/68   Pulse (!) 109   Temp 98.3 F (36.8 C)   Resp 16   Wt 189 lb 3.2 oz (85.8 kg)   SpO2 100%   BMI 29.63 kg/m       Objective:   Physical Exam  General Mental Status- Alert. General Appearance- Not in acute distress.   Skin General: Color- Normal Color. Moisture- Normal Moisture.  Neck Carotid Arteries- Normal color. Moisture- Normal Moisture. No carotid bruits. No JVD.  Chest and Lung Exam Auscultation: Breath Sounds:-Normal.  Cardiovascular Auscultation:Rythm- Regular. Murmurs & Other Heart Sounds:Auscultation of the heart reveals- No Murmurs.  Abdomen Inspection:-Inspeection Normal. Palpation/Percussion:Note:No mass. Palpation and Percussion of the abdomen reveal- Non Tender, Non Distended + BS, no rebound or guarding.  Neurologic Cranial Nerve exam:- CN III-XII intact(No nystagmus), symmetric smile. Strength:- 5/5 equal and symmetric strength both upper and lower extremities.      Assessment & Plan:  For recent anxiety from work events and panic attack, decided to go ahead and prescribe with clonazepam 0.5 mg 14 tablets to use 1 tablet twice daily as needed for severe anxiety or panic.  Try to use this sparingly.  Also went ahead and sent medication called Effexor to your pharmacy.  If you are finding that anxiety is low-level chronic daily and having to use clonazepam too much then go ahead and start low-dose Effexor.  Recent minimal sore throat after eating hot soup.  Symptoms are much improved.  Exam only shows minimal red pharynx.  No associated signs or symptoms.  You expressed some concern for COVID but I do not think you present with COVID type symptoms.  However if your symptoms do change or worsen then recommend doing over-the-counter COVID test.  Follow-up in 10 days or as needed.    Esperanza Richters, PA-C

## 2020-08-14 NOTE — Telephone Encounter (Signed)
Pt says the with the coupon the cost was still about $1100. PA declined coverage, what would be the next steps? Pt asked for samples- I looked and we do not have any. She asked if another [facility] office had a ample if she could pick it up? I told her Id have to get the approval for that from managers first.   Please advise.

## 2020-08-16 NOTE — Telephone Encounter (Signed)
No other office would have samples and we could not provide her with enough samples anyway---  samples are meant to give 1-2  only to try not to give months----- I discussed this with her in past and already told her we have not had samples for a long time due to covid and reps not coming into office

## 2020-08-17 NOTE — Telephone Encounter (Signed)
Pt returned call. I again explained that we did not have any samples neither did any of the other offices d/t limited drug rep visits. She asked for just 1 sample- I again reiterated and also let her know that- that isn't how this sample works. She asked for just 1 sample again.   Not really sure how else I can explain it to her.

## 2020-08-17 NOTE — Telephone Encounter (Signed)
Left vm to return call.    

## 2020-08-17 NOTE — Telephone Encounter (Signed)
I have spoken to amanda== she will contact pt

## 2020-08-26 ENCOUNTER — Telehealth: Payer: Self-pay

## 2020-08-26 NOTE — Telephone Encounter (Signed)
Patient called back stating her insurance never got a PA request for med. I explained that the denial came from  Cover my meds. I was informed to have her contact [facility]. She stated has already contacted them and that we should contact  [phone]. I passed over the # to CMA that agreed to research. I informed patient to give [place] sometime to look into it. She stated that she didn't think we would and that she would call back with them on 3-way and we will do pre-op over the phone.

## 2020-08-26 NOTE — Telephone Encounter (Signed)
Error

## 2020-08-31 ENCOUNTER — Telehealth: Payer: Self-pay

## 2020-08-31 NOTE — Telephone Encounter (Signed)
What is the center for excellence program--- when you get a chance can one of you call and find out

## 2020-08-31 NOTE — Telephone Encounter (Signed)
Please let pt know meds not covered We can refer to HWW or another weight loss program

## 2020-08-31 NOTE — Telephone Encounter (Signed)
Created on 08/28/20:  Veronica Stanton (Key: [id number]) Rx #: [id number] Saxenda 18MG pen-injectors   Form [employer] Electronic Prior Authorization Form (2017 NCPDP)   Response: 08/31/2020:  Weight loss treatments, including but not limited to medications, diet supplements, and surgeries outside the scope of the Centers of Excellence program, are not covered. Please contact [facility] at [phone] for further assistance.

## 2020-08-31 NOTE — Telephone Encounter (Signed)
Per google, The excellence program is apart of [employer]Looks like weight loss surgery might be a option but doesn't approve weight loss medication

## 2020-09-03 NOTE — Telephone Encounter (Signed)
LVM to have patient call back.   

## 2020-09-04 ENCOUNTER — Telehealth: Payer: Self-pay | Admitting: Family Medicine

## 2020-09-04 NOTE — Telephone Encounter (Signed)
Which medication ?

## 2020-09-04 NOTE — Telephone Encounter (Signed)
Pt, would like a copy of her prescription sent to her e-mail [email or web address]  Please advice

## 2020-11-09 ENCOUNTER — Ambulatory Visit: Payer: BC Managed Care – PPO | Admitting: Family Medicine

## 2020-11-24 ENCOUNTER — Telehealth (INDEPENDENT_AMBULATORY_CARE_PROVIDER_SITE_OTHER): Payer: Self-pay | Admitting: Family Medicine

## 2020-11-24 ENCOUNTER — Encounter: Payer: Self-pay | Admitting: Family Medicine

## 2020-11-24 ENCOUNTER — Telehealth: Payer: Self-pay | Admitting: Family Medicine

## 2020-11-24 ENCOUNTER — Other Ambulatory Visit: Payer: Self-pay

## 2020-11-24 VITALS — Temp 100.5°F

## 2020-11-24 DIAGNOSIS — U071 COVID-19: Secondary | ICD-10-CM

## 2020-11-24 MED ORDER — WEGOVY 0.25 MG/0.5ML ~~LOC~~ SOAJ: 0.2500 mg | SUBCUTANEOUS | 0 refills | Status: DC

## 2020-11-24 NOTE — Telephone Encounter (Signed)
Letter corrected and sent to Mychart

## 2020-11-24 NOTE — Assessment & Plan Note (Signed)
con't with otc Pt does not want to take the antiviral at this time Quarantine for 5 days and then 5 more with mask as long as symptoms are improving  Call back prn

## 2020-11-24 NOTE — Assessment & Plan Note (Signed)
Pt would like printed rx for wegovy rto 1 month

## 2020-11-24 NOTE — Progress Notes (Signed)
MyChart Video Visit    Virtual Visit via Video Note   This visit type was conducted due to national recommendations for restrictions regarding the COVID-19 Pandemic (e.g. social distancing) in an effort to limit this patient's exposure and mitigate transmission in our community. This patient is at least at moderate risk for complications without adequate follow up. This format is felt to be most appropriate for this patient at this time. Physical exam was limited by quality of the video and audio technology used for the visit. Luster Landsberg was able to get the patient set up on a video visit.  Patient location: Home Patient and provider in visit Provider location: Office  I discussed the limitations of evaluation and management by telemedicine and the availability of in person appointments. The patient expressed understanding and agreed to proceed.  Visit Date: 11/24/2020  Today's healthcare provider: Donato Schultz, DO     Subjective:    Patient ID: Veronica Stanton, Veronica Stanton    DOB: 1980-08-08, 40 y.o.   MRN: [id number]  No chief complaint on file.   HPI Patient is in today for a video visit. She complains of sore throat, cough, congestion, headaches, and chills. She tested positive for Covid-19 on 11/23/2020. Her symptoms started on Saturday, 11/21/2020 while at work. Her last measured temperature this morning was 100.5 degrees. She did not measure her blood pressure yet. She is not decided on taking the anti-viral medication to manage her symptoms.  She is struggling to lose weight at this time. She is willing to see a healthy weight and wellness clinic to assist with her weight loss. She is also requesting to try wegovy weight loss medication to manage her weight loss.    Past Medical History:  Diagnosis Date  . No diagnosis   . Palpitations     Past Surgical History:  Procedure Laterality Date  . negative      Family History  Problem Relation Age of Onset   . Congestive Heart Failure Father   . Kidney failure Father   . Diabetes Mellitus II Paternal Aunt   . AAA (abdominal aortic aneurysm) Paternal Aunt   . Stroke Neg Hx   . Heart disease Neg Hx   . Mental illness Neg Hx     Social History   Socioeconomic History  . Marital status: Single    Spouse name: Not on file  . Number of children: Not on file  . Years of education: Not on file  . Highest education level: Not on file  Occupational History  . Not on file  Tobacco Use  . Smoking status: Never  . Smokeless tobacco: Never  Vaping Use  . Vaping Use: Never used  Substance and Sexual Activity  . Alcohol use: No  . Drug use: No  . Sexual activity: Not on file  Other Topics Concern  . Not on file  Social History Narrative  . Not on file   Social Determinants of Health   Financial Resource Strain: Not on file  Food Insecurity: Not on file  Transportation Needs: Not on file  Physical Activity: Not on file  Stress: Not on file  Social Connections: Not on file  Intimate Partner Violence: Not on file    Outpatient Medications Prior to Visit  Medication Sig Dispense Refill  . clonazePAM (KLONOPIN) 0.5 MG tablet Take 1 tablet (0.5 mg total) by mouth 2 (two) times daily as needed for anxiety. 14 tablet 0  . fluticasone (FLONASE)  50 MCG/ACT nasal spray Place 2 sprays into both nostrils daily. 16 g 6  . hydrochlorothiazide (HYDRODIURIL) 25 MG tablet Take 1 tablet (25 mg total) by mouth daily. 90 tablet 3  . Insulin Pen Needle (NOVOFINE PEN NEEDLE) 32G X 6 MM MISC As directed 100 each 1  . Insulin Pen Needle (NOVOFINE) 32G X 6 MM MISC 1 Dose by Does not apply route daily. 100 each 2  . Liraglutide -Weight Management (SAXENDA) 18 MG/3ML SOPN 3 mg sq qd 15 mL 3  . Liraglutide -Weight Management (SAXENDA) 18 MG/3ML SOPN Inject 3 mg into the skin daily. 15 mL 3  . loratadine (CLARITIN) 10 MG tablet Take 1 tablet (10 mg total) by mouth daily. 30 tablet 11  . topiramate (TOPAMAX) 25  MG tablet Take 3 tablets daily 270 tablet 3  . venlafaxine XR (EFFEXOR XR) 37.5 MG 24 hr capsule Take 1 capsule (37.5 mg total) by mouth daily with breakfast. 30 capsule 0   No facility-administered medications prior to visit.    No Known Allergies  Review of Systems  Constitutional:  Positive for chills and fever.  HENT:  Positive for congestion and sore throat.   Respiratory:  Positive for cough.   Neurological:  Positive for headaches.      Objective:    Physical Exam Constitutional:      General: She is not in acute distress.    Appearance: Normal appearance. She is not ill-appearing.  HENT:     Head: Normocephalic.  Pulmonary:     Effort: Pulmonary effort is normal.  Neurological:     Mental Status: She is alert.  Psychiatric:        Mood and Affect: Mood normal.        Behavior: Behavior normal.        Thought Content: Thought content normal.        Judgment: Judgment normal.    There were no vitals taken for this visit. Wt Readings from Last 3 Encounters:  08/12/20 189 lb 3.2 oz (85.8 kg)  08/10/20 188 lb 3.2 oz (85.4 kg)  06/15/20 197 lb 9.6 oz (89.6 kg)    Diabetic Foot Exam - Simple   No data filed    Lab Results  Component Value Date   WBC 7.7 09/24/2019   HGB 12.2 09/24/2019   HCT 36.5 09/24/2019   PLT 260.0 09/24/2019   GLUCOSE 84 01/02/2020   CHOL 180 09/24/2019   TRIG 54.0 09/24/2019   HDL 50.30 09/24/2019   LDLCALC 119 (H) 09/24/2019   ALT 12 01/02/2020   AST 19 01/02/2020   NA 136 01/02/2020   K 4.6 01/02/2020   CL 104 01/02/2020   CREATININE 0.78 01/02/2020   BUN 8 01/02/2020   CO2 23 01/02/2020   TSH 0.83 01/02/2020   HGBA1C 5.0 01/02/2020    Lab Results  Component Value Date   TSH 0.83 01/02/2020   Lab Results  Component Value Date   WBC 7.7 09/24/2019   HGB 12.2 09/24/2019   HCT 36.5 09/24/2019   MCV 93.1 09/24/2019   PLT 260.0 09/24/2019   Lab Results  Component Value Date   NA 136 01/02/2020   K 4.6 01/02/2020    CO2 23 01/02/2020   GLUCOSE 84 01/02/2020   BUN 8 01/02/2020   CREATININE 0.78 01/02/2020   BILITOT 0.4 01/02/2020   ALKPHOS 40 09/24/2019   AST 19 01/02/2020   ALT 12 01/02/2020   PROT 7.2 01/02/2020   ALBUMIN 3.9  09/24/2019   CALCIUM 8.9 01/02/2020   ANIONGAP 5 07/19/2014   GFR 85.64 09/24/2019   Lab Results  Component Value Date   CHOL 180 09/24/2019   Lab Results  Component Value Date   HDL 50.30 09/24/2019   Lab Results  Component Value Date   LDLCALC 119 (H) 09/24/2019   Lab Results  Component Value Date   TRIG 54.0 09/24/2019   Lab Results  Component Value Date   CHOLHDL 4 09/24/2019   Lab Results  Component Value Date   HGBA1C 5.0 01/02/2020       Assessment & Plan:   Problem List Items Addressed This Visit   None    No orders of the defined types were placed in this encounter.   I discussed the assessment and treatment plan with the patient. The patient was provided an opportunity to ask questions and all were answered. The patient agreed with the plan and demonstrated an understanding of the instructions.   The patient was advised to call back or seek an in-person evaluation if the symptoms worsen or if the condition fails to improve as anticipated.  I,Shehryar Baig,acting as a Neurosurgeon for [employer], DO.,have documented all relevant documentation on the behalf of Donato Schultz, DO,as directed by  Donato Schultz, DO while in the presence of Donato Schultz, DO.  I provided 20 minutes of face-to-face time during this encounter.   Donato Schultz, DO [facility] [phone] (phone) [phone] (fax)  [facility]

## 2020-11-24 NOTE — Telephone Encounter (Signed)
Patient states the wrong date was written on her paperwork for work. The return to work is supposed to be 09/05 not 09/03. Could this please be fixed. She needs it to be sent to her My Chart or she can come pick it up. Please advice.

## 2020-11-24 NOTE — Telephone Encounter (Signed)
See telephone note.

## 2021-03-01 ENCOUNTER — Ambulatory Visit: Payer: BC Managed Care – PPO | Admitting: Medical

## 2021-03-02 ENCOUNTER — Ambulatory Visit (INDEPENDENT_AMBULATORY_CARE_PROVIDER_SITE_OTHER): Payer: BC Managed Care – PPO | Admitting: Family

## 2021-03-02 VITALS — BP 126/84 | HR 80 | Temp 98.6°F | Resp 16 | Ht 65.0 in | Wt 202.0 lb

## 2021-03-02 DIAGNOSIS — E669 Obesity, unspecified: Secondary | ICD-10-CM | POA: Diagnosis not present

## 2021-03-02 DIAGNOSIS — L309 Dermatitis, unspecified: Secondary | ICD-10-CM | POA: Diagnosis not present

## 2021-03-02 DIAGNOSIS — Z713 Dietary counseling and surveillance: Secondary | ICD-10-CM | POA: Diagnosis not present

## 2021-03-02 MED ORDER — CONTRAVE 8-90 MG PO TB12: ORAL_TABLET | ORAL | 0 refills | Status: DC

## 2021-03-02 NOTE — Patient Instructions (Signed)
Begin contrave starter pack.  Work on [employer] and exercise changes we discussed today at your appointment.

## 2021-03-02 NOTE — Progress Notes (Signed)
Subjective:     Patient ID: Veronica Stanton, female    DOB: 1980/07/08, 40 y.o.   MRN: [id number]  Chief Complaint  Patient presents with   Weight loss management    Here to discuss weight management.     HPI Patient is in today for to discuss weight management.  Works as a Emergency planning/management officer.Veronica Stanton does walk about 2 miles a day at work.    Breakfast-  pickle.   Lunch- shake (Naked drinks or Banana Strawberry) Dinner- Broccolini  roll, baked chicken, or potato salad.  Veronica Stanton does eat out a lot bu tries to choose healthier options like [employer].  Veronica Stanton has had more sweets at work.  Beverages- water, occasional orange juice.  ICE zero sugar drinks.    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   Veronica Stanton walks about 1 mile about once a week.    Veronica Stanton is requesting a "pill" to help Veronica Stanton with weight loss.   Veronica Stanton is unable to commit to a program such as [facility] due to Veronica Stanton long work hours.  Wt Readings from Last 3 Encounters:  03/02/21 202 lb (91.6 kg)  08/12/20 189 lb 3.2 oz (85.8 kg)  08/10/20 188 lb 3.2 oz (85.4 kg)    Health Maintenance Due  Topic Date Due   COVID-19 Vaccine (1) Never done   HIV Screening  Never done   Hepatitis C Screening  Never done   TETANUS/TDAP  Never done   PAP SMEAR-Modifier  Never done   INFLUENZA VACCINE  Never done    Past Medical History:  Diagnosis Date   No diagnosis    Palpitations     Past Surgical History:  Procedure Laterality Date   negative      Family History  Problem Relation Age of Onset   Congestive Heart Failure Father    Kidney failure Father    Diabetes Mellitus II Paternal Aunt    AAA (abdominal aortic aneurysm) Paternal Aunt    Stroke Neg Hx    Heart disease Neg Hx    Mental illness Neg Hx     Social History   Socioeconomic History   Marital status: Single    Spouse name: Not on file   Number of children: Not on file   Years of education: Not on file   Highest  education level: Not on file  Occupational History   Not on file  Tobacco Use   Smoking status: Never   Smokeless tobacco: Never  Vaping Use   Vaping Use: Never used  Substance and Sexual Activity   Alcohol use: No   Drug use: No   Sexual activity: Not on file  Other Topics Concern   Not on file  Social History Narrative   Not on file   Social Determinants of Health   Financial Resource Strain: Not on file  Food Insecurity: Not on file  Transportation Needs: Not on file  Physical Activity: Not on file  Stress: Not on file  Social Connections: Not on file  Intimate Partner Violence: Not on file    Outpatient Medications Prior to Visit  Medication Sig Dispense Refill   clonazePAM (KLONOPIN) 0.5 MG tablet Take 1 tablet (0.5 mg total) by mouth 2 (two) times daily as needed for anxiety. 14 tablet 0   fluticasone (FLONASE) 50 MCG/ACT nasal spray Place 2 sprays into both nostrils daily. 16 g 6   Insulin Pen Needle (NOVOFINE PEN  NEEDLE) 32G X 6 MM MISC As directed 100 each 1   loratadine (CLARITIN) 10 MG tablet Take 1 tablet (10 mg total) by mouth daily. 30 tablet 11   topiramate (TOPAMAX) 25 MG tablet Take 3 tablets daily 270 tablet 3   hydrochlorothiazide (HYDRODIURIL) 25 MG tablet Take 1 tablet (25 mg total) by mouth daily. 90 tablet 3   Insulin Pen Needle (NOVOFINE) 32G X 6 MM MISC 1 Dose by Does not apply route daily. 100 each 2   Liraglutide -Weight Management (SAXENDA) 18 MG/3ML SOPN 3 mg sq qd 15 mL 3   Liraglutide -Weight Management (SAXENDA) 18 MG/3ML SOPN Inject 3 mg into the skin daily. 15 mL 3   Semaglutide-Weight Management (WEGOVY) 0.25 MG/0.5ML SOAJ Inject 0.25 mg into the skin once a week. 2 mL 0   venlafaxine XR (EFFEXOR XR) 37.5 MG 24 hr capsule Take 1 capsule (37.5 mg total) by mouth daily with breakfast. 30 capsule 0   No facility-administered medications prior to visit.    No Known Allergies  ROS    See HPI  Objective:    Physical  Exam Constitutional:      Appearance: Normal appearance.  HENT:     Head: Normocephalic.  Cardiovascular:     Rate and Rhythm: Normal rate.  Pulmonary:     Effort: Pulmonary effort is normal.  Skin:    General: Skin is warm and dry.     Comments: Dry eczematous patches noted on bilateral thighs  Neurological:     Mental Status: Veronica Stanton is alert.    BP 126/84 (BP Location: Right Arm, Patient Position: Sitting, Cuff Size: Small)   Pulse 80   Temp 98.6 F (37 C) (Oral)   Resp 16   Ht 5\' 5"  (1.651 m)   Wt 202 lb (91.6 kg)   SpO2 100%   BMI 33.61 kg/m  Wt Readings from Last 3 Encounters:  03/02/21 202 lb (91.6 kg)  08/12/20 189 lb 3.2 oz (85.8 kg)  08/10/20 188 lb 3.2 oz (85.4 kg)       Assessment & Plan:   Problem List Items Addressed This Visit       Unprioritized   Obesity (BMI 30.0-34.9)    Veronica Stanton was previously utilizing samples of Ozempic, but we are no longer carrying those samples and it is not covered by Veronica Stanton insurance. We spoke at length about positive changes Veronica Stanton can make to Veronica Stanton diet and exercise. Veronica Stanton to follow up with PCP in 1 month.   Addendum:  Veronica did initially send Rx for Contrave, but after patient left and further chart review,  Veronica saw that Veronica Stanton had previous hx of seizure.  Veronica Stanton that Veronica Stanton not begin Contrave due to risk of seizure with bupropion. Veronica Stanton is interested in phentermine.  Veronica told Veronica Stanton Veronica would reach out to Veronica Stanton neurologist to discuss prior to prescribing.       Eczema    New. Discussed use of gentle moisturizer such as cetaphil ointment, fragrance/dye free detergents/soaps and bid up to 2 week application of steroid cream. See phone note 12/7.       Other Visit Diagnoses     Encounter for weight loss counseling    -  Primary       Veronica Stanton NovoFine, hydrochlorothiazide, Saxenda, Saxenda, venlafaxine XR, Wegovy, and Contrave. Veronica am also having Veronica Stanton start on triamcinolone cream. Additionally, Veronica am having Veronica Stanton  maintain Veronica Stanton topiramate, fluticasone, loratadine, Novofine Pen  Needle, and clonazePAM.  Meds ordered this encounter  Medications   DISCONTD: Naltrexone-buPROPion HCl ER (CONTRAVE) 8-90 MG TB12    Sig: Start 1 tablet every morning for 7 days, then 1 tablet twice daily for 7 days, then 2 tablets every morning and one every evening    Dispense:  120 tablet    Refill:  0    Order Specific Question:   Supervising Provider    Answer:   Danise Edge A [[id number]]   triamcinolone cream (KENALOG) 0.1 %    Sig: Apply 1 application topically 2 (two) times daily.    Dispense:  30 g    Refill:  0    Order Specific Question:   Supervising Provider    Answer:   Danise Edge A [[id number]]

## 2021-03-02 NOTE — Progress Notes (Signed)
.  wei

## 2021-03-03 ENCOUNTER — Telehealth: Payer: Self-pay | Admitting: Family

## 2021-03-03 DIAGNOSIS — L309 Dermatitis, unspecified: Secondary | ICD-10-CM | POA: Insufficient documentation

## 2021-03-03 MED ORDER — TRIAMCINOLONE ACETONIDE 0.1 % EX CREA: 1.0000 "application " | TOPICAL_CREAM | Freq: Two times a day (BID) | CUTANEOUS | 0 refills | Status: DC

## 2021-03-03 NOTE — Assessment & Plan Note (Addendum)
She was previously utilizing samples of Ozempic, but we are no longer carrying those samples and it is not covered by her insurance. We spoke at length about positive changes she can make to her diet and exercise. She is advised to follow up with PCP in 1 month.   Addendum:  I did initially send Rx for Contrave, but after patient left and further chart review,  I saw that she had previous hx of seizure.  I advised that she not begin Contrave due to risk of seizure with bupropion. She is interested in phentermine.  I told her I would reach out to her neurologist to discuss prior to prescribing.

## 2021-03-03 NOTE — Telephone Encounter (Signed)
Spoke to patient and advised her that upon further review of her record, that I do not believe it is safe for her to begin Contrave due to previous hx of seizure.  She verbalizes understanding. She is interested in phentermine- states that her neurologist approved her use of phentermine in the past.  I will reach out to her neurologist and get her thoughts on this.

## 2021-03-03 NOTE — Assessment & Plan Note (Signed)
New. Discussed use of gentle moisturizer such as cetaphil ointment, fragrance/dye free detergents/soaps and bid up to 2 week application of steroid cream. See phone note 12/7.

## 2021-03-08 ENCOUNTER — Telehealth: Payer: Self-pay | Admitting: Family

## 2021-03-08 NOTE — Telephone Encounter (Signed)
Patient advised of provider's message. She has a current rx for topamax with refills. She will wait to hear from Dr. Karel Jarvis. She was also advised about the phentermine and the advise to continue diet and exercise instead. She verbalized understanding.

## 2021-03-08 NOTE — Telephone Encounter (Signed)
Please advised pt that I reviewed her case with Dr. Karel Jarvis.  She recommends that patient stay on Topamax and follow up with her. She is putting her on the wait list.  If she needs a refill on her topamax in the meantime, we can send a 90 supply to get her through until she sees Dr. Karel Jarvis.   In terms of Phentermine- I discussed this with her PCP, Dr. Zola Button and she does not prescribe phentermine. She and I would like instead for her to work on diet/exercise/weight loss as we discussed at her visit.

## 2021-03-08 NOTE — Telephone Encounter (Signed)
Lvm for patient to call back about this message 

## 2021-04-02 ENCOUNTER — Ambulatory Visit (INDEPENDENT_AMBULATORY_CARE_PROVIDER_SITE_OTHER): Payer: BC Managed Care – PPO | Admitting: Family Medicine

## 2021-04-02 ENCOUNTER — Encounter: Payer: Self-pay | Admitting: Family Medicine

## 2021-04-02 VITALS — BP 124/88 | HR 78 | Temp 97.6°F | Resp 16 | Ht 65.0 in | Wt 203.4 lb

## 2021-04-02 DIAGNOSIS — Z7251 High risk heterosexual behavior: Secondary | ICD-10-CM

## 2021-04-02 DIAGNOSIS — L2082 Flexural eczema: Secondary | ICD-10-CM | POA: Diagnosis not present

## 2021-04-02 DIAGNOSIS — L819 Disorder of pigmentation, unspecified: Secondary | ICD-10-CM | POA: Diagnosis not present

## 2021-04-02 MED ORDER — CLOTRIMAZOLE-BETAMETHASONE 1-0.05 % EX CREA: 1.0000 "application " | TOPICAL_CREAM | Freq: Every day | CUTANEOUS | 0 refills | Status: DC

## 2021-04-02 NOTE — Assessment & Plan Note (Signed)
Her insurance will not pay for meds We have offered HWW in past and she can not do that with her work schedule She can not have contrave due to her past hx of seizures  And phentermine is not appropriate  Cont' with diet and exercise  Rep did mention that she could speak to her HR and see if they would reconsider covering weight loss meds in the future

## 2021-04-02 NOTE — Assessment & Plan Note (Signed)
Pt requesting derm referral

## 2021-04-02 NOTE — Assessment & Plan Note (Signed)
Lotrisone written Refer to derm

## 2021-04-02 NOTE — Assessment & Plan Note (Signed)
We did some in the labs but we were unable to do gen probe today She would need rto for that

## 2021-04-02 NOTE — Progress Notes (Signed)
Established Patient Office Visit  Subjective:  Patient ID: Veronica Stanton, female    DOB: Aug 26, 1980  Age: 41 y.o. MRN: [id number]  CC:  Chief Complaint  Patient presents with   Weight Check    HPI Veronica Stanton presents for f/u rash on her legs--- the cream does not work and the rash does not itch and is not dry.  She has mult places and lemon juice and baking soda help more.  She also c/o dry patch on L elbow  She is interested in weight loss meds but her ins will not pay for them   Past Medical History:  Diagnosis Date   No diagnosis    Palpitations     Past Surgical History:  Procedure Laterality Date   negative      Family History  Problem Relation Age of Onset   Congestive Heart Failure Father    Kidney failure Father    Diabetes Mellitus II Paternal Aunt    AAA (abdominal aortic aneurysm) Paternal Aunt    Stroke Neg Hx    Heart disease Neg Hx    Mental illness Neg Hx     Social History   Socioeconomic History   Marital status: Single    Spouse name: Not on file   Number of children: Not on file   Years of education: Not on file   Highest education level: Not on file  Occupational History   Not on file  Tobacco Use   Smoking status: Never   Smokeless tobacco: Never  Vaping Use   Vaping Use: Never used  Substance and Sexual Activity   Alcohol use: No   Drug use: No   Sexual activity: Not on file  Other Topics Concern   Not on file  Social History Narrative   Not on file   Social Determinants of Health   Financial Resource Strain: Not on file  Food Insecurity: Not on file  Transportation Needs: Not on file  Physical Activity: Not on file  Stress: Not on file  Social Connections: Not on file  Intimate Partner Violence: Not on file    Outpatient Medications Prior to Visit  Medication Sig Dispense Refill   clonazePAM (KLONOPIN) 0.5 MG tablet Take 1 tablet (0.5 mg total) by mouth 2 (two) times daily as  needed for anxiety. 14 tablet 0   fluticasone (FLONASE) 50 MCG/ACT nasal spray Place 2 sprays into both nostrils daily. 16 g 6   Insulin Pen Needle (NOVOFINE PEN NEEDLE) 32G X 6 MM MISC As directed 100 each 1   loratadine (CLARITIN) 10 MG tablet Take 1 tablet (10 mg total) by mouth daily. 30 tablet 11   topiramate (TOPAMAX) 25 MG tablet Take 3 tablets daily 270 tablet 3   triamcinolone cream (KENALOG) 0.1 % Apply 1 application topically 2 (two) times daily. 30 g 0   No facility-administered medications prior to visit.    No Known Allergies  ROS Review of Systems  Constitutional:  Negative for appetite change, diaphoresis, fatigue and unexpected weight change.  Eyes:  Negative for pain, redness and visual disturbance.  Respiratory:  Negative for cough, chest tightness, shortness of breath and wheezing.   Cardiovascular:  Negative for chest pain, palpitations and leg swelling.  Endocrine: Negative for cold intolerance, heat intolerance, polydipsia, polyphagia and polyuria.  Genitourinary:  Negative for difficulty urinating, dysuria and frequency.  Skin:  Positive  for rash.  Neurological:  Negative for dizziness, light-headedness, numbness and headaches.     Objective:    Physical Exam Vitals and nursing note reviewed.  Constitutional:      Appearance: She is well-developed.  HENT:     Head: Normocephalic and atraumatic.  Eyes:     Conjunctiva/sclera: Conjunctivae normal.  Neck:     Thyroid: No thyromegaly.     Vascular: No carotid bruit or JVD.  Cardiovascular:     Rate and Rhythm: Normal rate and regular rhythm.     Heart sounds: Normal heart sounds. No murmur heard. Pulmonary:     Effort: Pulmonary effort is normal. No respiratory distress.     Breath sounds: Normal breath sounds. No wheezing or rales.  Chest:     Chest wall: No tenderness.  Musculoskeletal:     Cervical back: Normal range of motion and neck supple.  Skin:    Findings: Lesion and rash present.        Neurological:     Mental Status: She is alert and oriented to person, place, and time.  Psychiatric:        Mood and Affect: Mood normal.        Behavior: Behavior normal.        Thought Content: Thought content normal.        Judgment: Judgment normal.   BP 124/88 (BP Location: Left Arm, Patient Position: Sitting, Cuff Size: Normal)    Pulse 78    Temp 97.6 F (36.4 C) (Oral)    Resp 16    Ht 5\' 5"  (1.651 m)    Wt 203 lb 6.4 oz (92.3 kg)    SpO2 100%    BMI 33.85 kg/m  Wt Readings from Last 3 Encounters:  04/02/21 203 lb 6.4 oz (92.3 kg)  03/02/21 202 lb (91.6 kg)  08/12/20 189 lb 3.2 oz (85.8 kg)     Health Maintenance Due  Topic Date Due   COVID-19 Vaccine (1) Never done   HIV Screening  Never done   Hepatitis C Screening  Never done   TETANUS/TDAP  Never done   PAP SMEAR-Modifier  Never done   INFLUENZA VACCINE  Never done    There are no preventive care reminders to display for this patient.  Lab Results  Component Value Date   TSH 0.83 01/02/2020   Lab Results  Component Value Date   WBC 7.7 09/24/2019   HGB 12.2 09/24/2019   HCT 36.5 09/24/2019   MCV 93.1 09/24/2019   PLT 260.0 09/24/2019   Lab Results  Component Value Date   NA 136 01/02/2020   K 4.6 01/02/2020   CO2 23 01/02/2020   GLUCOSE 84 01/02/2020   BUN 8 01/02/2020   CREATININE 0.78 01/02/2020   BILITOT 0.4 01/02/2020   ALKPHOS 40 09/24/2019   AST 19 01/02/2020   ALT 12 01/02/2020   PROT 7.2 01/02/2020   ALBUMIN 3.9 09/24/2019   CALCIUM 8.9 01/02/2020   ANIONGAP 5 07/19/2014   GFR 85.64 09/24/2019   Lab Results  Component Value Date   CHOL 180 09/24/2019   Lab Results  Component Value Date   HDL 50.30 09/24/2019   Lab Results  Component Value Date   LDLCALC 119 (H) 09/24/2019   Lab Results  Component Value Date   TRIG 54.0 09/24/2019   Lab Results  Component Value Date   CHOLHDL 4 09/24/2019   Lab Results  Component Value Date   HGBA1C 5.0 01/02/2020  Assessment & Plan:   Problem List Items Addressed This Visit       Unprioritized   Eczema    Lotrisone written Refer to derm        Relevant Orders   Ambulatory referral to Dermatology   High risk heterosexual behavior    We did some in the labs but we were unable to do gen probe today She would need rto for that       Relevant Orders   RPR   HIV Antibody (routine testing w rflx)   Hepatitis C antibody   Hepatitis B surface antibody,quantitative   Hyperpigmentation - Primary    Pt requesting derm referral        Relevant Medications   clotrimazole-betamethasone (LOTRISONE) cream   Other Relevant Orders   Ambulatory referral to Dermatology   Morbid obesity ([place])    Her insurance will not pay for meds We have offered HWW in past and she can not do that with her work schedule She can not have contrave due to her past hx of seizures  And phentermine is not appropriate  Cont' with diet and exercise  Rep did mention that she could speak to her HR and see if they would reconsider covering weight loss meds in the future       Relevant Orders   TSH   Insulin, random   CBC with Differential/Platelet   Comprehensive metabolic panel   Hemoglobin A1c   Lipid panel   VITAMIN D 25 Hydroxy (Vit-D Deficiency, Fractures)   Vitamin B12    Meds ordered this encounter  Medications   clotrimazole-betamethasone (LOTRISONE) cream    Sig: Apply 1 application topically daily.    Dispense:  30 g    Refill:  0    Follow-up: Return if symptoms worsen or fail to improve, for annual exam, fasting.    Ann Held, DO

## 2021-04-02 NOTE — Patient Instructions (Signed)

## 2021-04-05 ENCOUNTER — Telehealth: Payer: Self-pay

## 2021-04-05 LAB — CBC WITH DIFFERENTIAL/PLATELET
Absolute Monocytes: 805 cells/uL (ref 200–950)
Basophils Absolute: 42 cells/uL (ref 0–200)
Basophils Relative: 0.5 %
Eosinophils Absolute: 83 cells/uL (ref 15–500)
Eosinophils Relative: 1 %
HCT: 34.8 % — ABNORMAL LOW (ref 35.0–45.0)
Hemoglobin: 11.5 g/dL — ABNORMAL LOW (ref 11.7–15.5)
Lymphs Abs: 2341 cells/uL (ref 850–3900)
MCH: 29.6 pg (ref 27.0–33.0)
MCHC: 33 g/dL (ref 32.0–36.0)
MCV: 89.5 fL (ref 80.0–100.0)
MPV: 12.1 fL (ref 7.5–12.5)
Monocytes Relative: 9.7 %
Neutro Abs: 5030 cells/uL (ref 1500–7800)
Neutrophils Relative %: 60.6 %
Platelets: 209 10*3/uL (ref 140–400)
RBC: 3.89 10*6/uL (ref 3.80–5.10)
RDW: 14.8 % (ref 11.0–15.0)
Total Lymphocyte: 28.2 %
WBC: 8.3 10*3/uL (ref 3.8–10.8)

## 2021-04-05 LAB — COMPREHENSIVE METABOLIC PANEL
AG Ratio: 1.1 (calc) (ref 1.0–2.5)
ALT: 12 U/L (ref 6–29)
AST: 12 U/L (ref 10–30)
Albumin: 3.9 g/dL (ref 3.6–5.1)
Alkaline phosphatase (APISO): 37 U/L (ref 31–125)
BUN: 9 mg/dL (ref 7–25)
CO2: 23 mmol/L (ref 20–32)
Calcium: 9.1 mg/dL (ref 8.6–10.2)
Chloride: 104 mmol/L (ref 98–110)
Creat: 0.79 mg/dL (ref 0.50–0.99)
Globulin: 3.4 g/dL (calc) (ref 1.9–3.7)
Glucose, Bld: 94 mg/dL (ref 65–99)
Potassium: 4.2 mmol/L (ref 3.5–5.3)
Sodium: 138 mmol/L (ref 135–146)
Total Bilirubin: 0.3 mg/dL (ref 0.2–1.2)
Total Protein: 7.3 g/dL (ref 6.1–8.1)

## 2021-04-05 LAB — LIPID PANEL
Cholesterol: 188 mg/dL (ref <200)
HDL: 57 mg/dL (ref >=50)
LDL Cholesterol (Calc): 117 mg/dL (calc) — ABNORMAL HIGH
Non-HDL Cholesterol (Calc): 131 mg/dL (calc) — ABNORMAL HIGH (ref <130)
Total CHOL/HDL Ratio: 3.3 (calc) (ref <5.0)
Triglycerides: 51 mg/dL (ref <150)

## 2021-04-05 LAB — VITAMIN B12: Vitamin B-12: 323 pg/mL (ref 200–1100)

## 2021-04-05 LAB — VITAMIN D 25 HYDROXY (VIT D DEFICIENCY, FRACTURES): Vit D, 25-Hydroxy: 19 ng/mL — ABNORMAL LOW (ref 30–100)

## 2021-04-05 LAB — HEPATITIS B SURFACE ANTIBODY, QUANTITATIVE: Hep B S AB Quant (Post): <5 m[IU]/mL — ABNORMAL LOW (ref >=10)

## 2021-04-05 LAB — HEMOGLOBIN A1C
Hgb A1c MFr Bld: 5.2 % of total Hgb (ref <5.7)
Mean Plasma Glucose: 103 mg/dL
eAG (mmol/L): 5.7 mmol/L

## 2021-04-05 NOTE — Telephone Encounter (Signed)
Spoke with Veronica Stanton to add on TSH test Code 899 states it will take up to 2 to 3 business day. Will fax add on sheet and fax back.

## 2021-04-06 LAB — TEST AUTHORIZATION

## 2021-04-06 LAB — HEPATITIS C ANTIBODY
Hepatitis C Ab: NONREACTIVE
SIGNAL TO CUT-OFF: 0.02 (ref <1.00)

## 2021-04-06 LAB — HEPATITIS B SURFACE ANTIBODY, QUANTITATIVE: Hep B S AB Quant (Post): <5 m[IU]/mL — ABNORMAL LOW (ref >=10)

## 2021-04-06 LAB — TSH: TSH: 1.08 mIU/L

## 2021-04-06 LAB — HIV ANTIBODY (ROUTINE TESTING W REFLEX): HIV 1&2 Ab, 4th Generation: NONREACTIVE

## 2021-04-06 LAB — RPR: RPR Ser Ql: NONREACTIVE

## 2021-04-06 LAB — INSULIN, RANDOM: Insulin: 44.5 u[IU]/mL — ABNORMAL HIGH

## 2021-04-06 MED ORDER — ERGOCALCIFEROL 1.25 MG (50000 UT) PO CAPS: 50000.0000 [IU] | ORAL_CAPSULE | ORAL | 1 refills | Status: DC

## 2021-04-06 NOTE — Addendum Note (Signed)
Addended by: Lisbeth Renshaw, Olumide Dolinger HUA on: 04/06/2021 05:53 PM   Modules accepted: Orders

## 2021-04-13 ENCOUNTER — Telehealth: Payer: Self-pay | Admitting: Family Medicine

## 2021-04-13 ENCOUNTER — Other Ambulatory Visit: Payer: Self-pay

## 2021-04-13 MED ORDER — METFORMIN HCL 500 MG PO TABS: 500.0000 mg | ORAL_TABLET | Freq: Every day | ORAL | 2 refills | Status: DC

## 2021-04-13 NOTE — Telephone Encounter (Signed)
Pt called and stated she spoke with a nurse regarding metformin medication. There is no documentation of this. She stated she spoke with nurse a week ago and thought medication would have been sent in by now. She would like a call from a nurse explaining more about this medication before it gets prescribed. Please advise.   [facility] [address] [place] - [address]  [address] [place] [address]  Phone:  [phone]  Fax:  [phone]

## 2021-04-13 NOTE — Telephone Encounter (Signed)
Medication has been sent. Medication has been discussed with patient twice. See lab note. I also spoke with the patient on 04/06/21 to discuss labs and medications in the middle of rooming a patient and did not document it at the time.

## 2021-04-19 ENCOUNTER — Telehealth: Payer: Self-pay | Admitting: Family Medicine

## 2021-04-19 NOTE — Telephone Encounter (Signed)
Pt went home and did research on new meds and would like for someone to contact her to see if metformin interferes with topamax. Please advise.

## 2021-04-20 ENCOUNTER — Other Ambulatory Visit: Payer: Self-pay | Admitting: Family Medicine

## 2021-04-20 MED ORDER — OZEMPIC (0.25 OR 0.5 MG/DOSE) 2 MG/1.5ML ~~LOC~~ SOPN: PEN_INJECTOR | SUBCUTANEOUS | 2 refills | Status: DC

## 2021-04-20 NOTE — Telephone Encounter (Signed)
Spoke with patient.  I advised her about note below.  She asked if this had any interaction with any of her medications and is this Dr. Ivy Lynn professional opinion.  I advised her that this is her professional opinion.  She also asked if this had any interactions and I advised her that it should not be because of the first issue about interaction.  She stated that she needed to call back because this is not a good time to discuss and then she proceeded to talk.  She ask if this is a good medication per her professional opinion and I advised yes because this is what she prescribed.  She wanted to know about how long she needed to be taking the medication and I advised just a moment while I look back in your chart and she stated " so you have not looked at my chart and stated this is not good professional care".  I advised her that I was helping out and that her chart is right in front of me.  I then advised her it may be about 3-6 months when we recheck her.  After discussing everything she would like to start medication, Ozempic.

## 2021-04-20 NOTE — Telephone Encounter (Signed)
Rx sent in

## 2021-04-21 NOTE — Telephone Encounter (Signed)
Started PA for Ozempic. Med will likely be denied due to patient not having DM2. Would you like to switch it to [facility]?

## 2021-04-23 MED ORDER — WEGOVY 0.25 MG/0.5ML ~~LOC~~ SOAJ: 0.2500 mg | SUBCUTANEOUS | 0 refills | Status: DC

## 2021-04-23 NOTE — Addendum Note (Signed)
Addended by: Roxanne Gates on: 04/23/2021 09:43 AM   Modules accepted: Orders

## 2021-04-23 NOTE — Telephone Encounter (Signed)
[facility] sent in. Will need a PA.

## 2021-04-27 ENCOUNTER — Telehealth: Payer: Self-pay

## 2021-04-27 ENCOUNTER — Other Ambulatory Visit: Payer: Self-pay

## 2021-04-27 NOTE — Telephone Encounter (Signed)
Message from Plan Weight loss treatments, including but not limited to medications, diet supplements, and surgeries outside the scope of the [facility] program, are not covered. Please contact [facility] at [phone] for further assistance. 

## 2021-04-27 NOTE — Telephone Encounter (Signed)
Key: [id number]  Sent to plan

## 2021-04-28 NOTE — Telephone Encounter (Signed)
Pt called. LDVM and advised to call back if she has questions

## 2021-04-30 ENCOUNTER — Telehealth: Payer: Self-pay | Admitting: *Deleted

## 2021-04-30 NOTE — Telephone Encounter (Addendum)
I was asked to take a call a little after 405pm about this patients prior authorization because she was on the phone along with her insurance.  She stated that she was on hold waiting for the prior authorization folks to pickup.  While waiting on the phone She stated that she was needing a prior auth on Wegovy and Ozempic.  I advised that I will be happy to help.  While on hold patient was talking with the insurance stating that we did not ever send for the prior authorization and she had to take the whole day off to take care of this (she called at 405pm).  When insurance got on the phone they advised that they did not have any prior auths on file that was sent from our office.  They can see for the [facility] where the prior auth was cancelled.  The insurance Judeen Hammans from [employer]) advised patient that Mancel Parsons was a plan exclusion.  So pt decided just to do the Ozempic.  I answered all the questions for the prior authorization with a icd10 code for insulin resistance (the initial reason for medication the begin with).  Prior auth number for Ozempic is [id number].  Vinnie Level (insurance prior [place] lady) stated that pt will get a call from them and the doctors office will get a fax.    After insurance hung up patient continue to talk.  She stated that this is negligence on our part that we did not start the prior auth for this medication and also asked if we do this to other patients.  She then asked who was the supervisor of the Engineer, structural of the practice.  I advised who my supervisor was and she stated I want to know who is the person in charge of the practice.  I gave her Estill Bamberg name and she wanted email address so she can send email.  Patient felt like we should have done the prior auth even though she does not have type 2 dm.  While on hold with insurance pt was speaking about how our practice should have done this and it does make any sense so she will stay the phone with all of [place] to make sure it gets  done.    Please see phone message from 04/19/21.  Patient was not too happy at this time either.  Patient always speaking down about practice and questioning PCP professional opinion.  I advised patient that I will be on the lookout for fax from insurance.

## 2021-05-06 ENCOUNTER — Telehealth: Payer: Self-pay | Admitting: Family Medicine

## 2021-05-06 NOTE — Telephone Encounter (Signed)
Saint from patient insurance called and stated that she was on the phone with patient and that patient was not the line with [place].  She stated that she advised patient that the ozempic was denied because she was not type 2 diabetic and has not tried and fail other medications.  She still wanted an appeal done and not understanding the denial.  Insurance stated that she was stuck on that she is unable to take any other medications and not the other reason.  Advised that insurance fax over denial letter.   Insurance stated that we can do a verbal appeal.  They gave a phone number of [phone] for the appeal she tried transferring but was unable to and the appeals department will call back to get appeal started.  Spoke with Annabell with [employer]  and did a verbal appeal.  The case number is [id number].  Made the appeal urgent and it will take about 72hours for a determination.  Patient was notified of this and was appreciative of the update.

## 2021-05-06 NOTE — Telephone Encounter (Signed)
I have called the number and left a VM that the provider is not in the office until Monday.

## 2021-05-06 NOTE — Telephone Encounter (Signed)
Dr. Allena Katz would like for Dr. Laury Axon to return her call @ [phone] regarding patient meds.

## 2021-05-07 ENCOUNTER — Telehealth: Payer: Self-pay | Admitting: Family Medicine

## 2021-05-07 NOTE — Telephone Encounter (Signed)
Spoke with patient and advised that here appeal was denied.  She ask what is next step.  She is willing try another medication for her insulin being high as long as it does interfere or interact with her other medications.  She stated that she has not heard from Dr. Etter Sjogren since this started I advised that she has been out of town.  Again I advised her that we will give her a call back when Dr. Etter Sjogren message back if there is anything to take and patient hung up on me.

## 2021-05-07 NOTE — Telephone Encounter (Signed)
A1c noted to be 5.2. Will wait for PCP's return.

## 2021-05-07 NOTE — Telephone Encounter (Signed)
Pa appeals office called and stated pt has been denied ozempic. She stated this information has been faxed over as well.

## 2021-05-11 ENCOUNTER — Other Ambulatory Visit: Payer: Self-pay | Admitting: Family Medicine

## 2021-05-11 DIAGNOSIS — E8881 Metabolic syndrome: Secondary | ICD-10-CM

## 2021-05-11 MED ORDER — METFORMIN HCL 500 MG PO TABS: 500.0000 mg | ORAL_TABLET | Freq: Every day | ORAL | 2 refills | Status: DC

## 2021-05-11 NOTE — Telephone Encounter (Signed)
I am no sure. I have called number again. There was no answer.

## 2021-05-14 NOTE — Telephone Encounter (Signed)
Spoke with pt who simply wants to 'get her hands on a one month sample' because she feels she will only need the medication for one month. Is upset that we think thi is for weight loss and it is for insulin resistance. Will take the referral for a nutritionist but think that after one month of medication she will be 'ok'.

## 2021-05-14 NOTE — Telephone Encounter (Signed)
I believe patient didn't want to take this due to the interaction to her Topamax

## 2021-05-18 NOTE — Telephone Encounter (Signed)
Opened in error

## 2021-05-19 ENCOUNTER — Other Ambulatory Visit: Payer: Self-pay

## 2021-05-19 DIAGNOSIS — E669 Obesity, unspecified: Secondary | ICD-10-CM

## 2021-05-19 DIAGNOSIS — E8881 Metabolic syndrome: Secondary | ICD-10-CM

## 2021-05-21 ENCOUNTER — Telehealth: Payer: Self-pay | Admitting: Family Medicine

## 2021-05-21 NOTE — Telephone Encounter (Signed)
Pt is requesting an update regarding ozempic. Stated she spoke with [place] about this. Please advise.

## 2021-05-21 NOTE — Telephone Encounter (Signed)
Patient called reguarding her PA for medication Ozempic(which was deined) Patient was wanting to speak to someone on to why it was denied & why she was sent to a nutritionist     Pt requested to talk to [place]. I advised patient she was not here Patient also hung up the phone once I told her we had no way of finding anything out since it was after 5 and no one was here to take the call.

## 2021-05-26 NOTE — Telephone Encounter (Signed)
Spoke with patient. Pt states she was told by Apolonio Schneiders that Dr. Laury Axon could provide a waiver for the Ozempic. I ask Apolonio Schneiders if she recalls this conversation and she stated she never told the pt about a waiver. I advised patient the only thing I knew about was Patient Assistance and coupons available in office and online. Pt states she has been dealing with this for a month and no resolution and that she keeps hearing things from different people. She asked, "Where is Dr. Laury Axon?" And asked if Dr. Laury Axon was still employed here. Pt was advised if she wanted to talk to Dr. Laury Axon we could schedule a virtual visit and pt responded with "that's impossible". Pt was offered appt numerous times and coupons for Ozempic but pt declined and kept referring to a wavier. Pt proceeded to hang up on me. ?

## 2021-07-09 ENCOUNTER — Telehealth: Payer: Self-pay | Admitting: Neurology

## 2021-07-09 NOTE — Telephone Encounter (Signed)
Patient called for a refill on her Topamax 25mg . She has not been seen in two years. She made an appt 10/25/21 (first avail) she wants to know if aquino will send her enough meds to get her to her appt. I told her I would relay mess and see ?

## 2021-07-09 NOTE — Telephone Encounter (Signed)
Called patient and let her know that the only person that could approve this would be Dr. Delice Lesch one of the other physicians are going to refill her prescription when we haven't seen her in two years. I did look at her refill history and patient last filled prescription in 05/2020. I asked if she had been taking it she said yes but not as prescribed only "here and there". Told patient we would speak to the doctor and call her back on Monday  ?

## 2021-07-12 ENCOUNTER — Other Ambulatory Visit: Payer: Self-pay

## 2021-07-12 ENCOUNTER — Ambulatory Visit: Payer: BC Managed Care – PPO | Admitting: Registered"

## 2021-07-12 DIAGNOSIS — G40009 Localization-related (focal) (partial) idiopathic epilepsy and epileptic syndromes with seizures of localized onset, not intractable, without status epilepticus: Secondary | ICD-10-CM

## 2021-07-12 MED ORDER — TOPIRAMATE 25 MG PO TABS: ORAL_TABLET | ORAL | 0 refills | Status: DC

## 2021-07-12 NOTE — Telephone Encounter (Signed)
Patient is saying she is going to be taking it as prescribed and that will be 3x a day. I sent in prescription to last until her appointment in July. I did let the patient know she will need to attend the appointment in July for any further refills  ?

## 2021-07-12 NOTE — Telephone Encounter (Signed)
Can you pls check with patient how she has been taking it since she has not been seen since 2021, if only 1 tablet daily, I can send in 1 tab daily until her f/u in July, thanks ?

## 2021-08-24 ENCOUNTER — Telehealth: Payer: Self-pay | Admitting: Family Medicine

## 2021-08-24 NOTE — Telephone Encounter (Signed)
Noted  

## 2021-08-24 NOTE — Telephone Encounter (Signed)
Butch Penny from [employer] states that the [employer] .25 was denied but allows resubmission. She has already re-submitted PA and just wanted nurse to know key has changed to- [id number].

## 2021-09-17 ENCOUNTER — Telehealth: Payer: Self-pay

## 2021-09-20 ENCOUNTER — Telehealth: Payer: Self-pay | Admitting: Family Medicine

## 2021-09-21 ENCOUNTER — Ambulatory Visit (INDEPENDENT_AMBULATORY_CARE_PROVIDER_SITE_OTHER): Payer: Self-pay | Admitting: Family Medicine

## 2021-09-21 ENCOUNTER — Encounter: Payer: Self-pay | Admitting: Family Medicine

## 2021-09-21 VITALS — BP 116/84 | HR 85 | Temp 98.0°F | Resp 18 | Ht 65.0 in | Wt 207.6 lb

## 2021-09-21 DIAGNOSIS — R300 Dysuria: Secondary | ICD-10-CM

## 2021-09-21 DIAGNOSIS — E8881 Metabolic syndrome: Secondary | ICD-10-CM

## 2021-09-21 LAB — POC URINALSYSI DIPSTICK (AUTOMATED)
Bilirubin, UA: NEGATIVE
Blood, UA: NEGATIVE
Glucose, UA: NEGATIVE
Ketones, UA: NEGATIVE
Leukocytes, UA: NEGATIVE
Nitrite, UA: NEGATIVE
Protein, UA: NEGATIVE
Spec Grav, UA: >=1.03 — AB (ref 1.010–1.025)
Urobilinogen, UA: 0.2 E.U./dL
pH, UA: 5 (ref 5.0–8.0)

## 2021-09-21 MED ORDER — SAXENDA 18 MG/3ML ~~LOC~~ SOPN: 3.0000 mg | PEN_INJECTOR | Freq: Every day | SUBCUTANEOUS | 2 refills | Status: DC

## 2021-09-21 NOTE — Progress Notes (Signed)
Established Patient Office Visit  Subjective   Patient ID: Veronica Stanton, female    DOB: 04/05/1980  Age: 41 y.o. MRN: [id number]  Chief Complaint  Patient presents with   Urinary Frequency    X3 days, Pt states it felt odd to use the bathroom, no burning, freq or abd pain.    HPI Pt is here for c/o uti symptoms-----x since Wednesday --- no d/c , no burning.   No abd pain.  She c/o just a weird sensation when she had to urinate.   She is also asking for sample of saxenda to loose weight.  She has not been able to get to gym due to work.   Patient Active Problem List   Diagnosis Date Noted   Dysuria 09/24/2021   High risk heterosexual behavior 04/02/2021   Morbid obesity (HCC) 04/02/2021   Hyperpigmentation 04/02/2021   Eczema 03/03/2021   COVID-19 11/24/2020   Paroxysmal tachycardia (HCC) 06/15/2020   Lower extremity edema 06/15/2020   Obesity (BMI 30.0-34.9) 10/21/2019   History of seizures 01/03/2019   Altered level of consciousness 02/16/2018   Left arm weakness 06/08/2016   Localization-related idiopathic epilepsy and epileptic syndromes with seizures of localized onset, not intractable, without status epilepticus (HCC) 09/02/2014   Awareness alteration, transient 07/30/2014   Faintness 07/30/2014   Syncope 07/28/2014   Rapid palpitations 11/16/2011   Dyspnea 11/16/2011   Past Medical History:  Diagnosis Date   No diagnosis    Palpitations    Past Surgical History:  Procedure Laterality Date   negative     Social History   Tobacco Use   Smoking status: Never   Smokeless tobacco: Never  Vaping Use   Vaping Use: Never used  Substance Use Topics   Alcohol use: No   Drug use: No   Social History   Socioeconomic History   Marital status: Single    Spouse name: Not on file   Number of children: Not on file   Years of education: Not on file   Highest education level: Not on file  Occupational History   Not on file  Tobacco Use   Smoking status: Never    Smokeless tobacco: Never  Vaping Use   Vaping Use: Never used  Substance and Sexual Activity   Alcohol use: No   Drug use: No   Sexual activity: Not on file  Other Topics Concern   Not on file  Social History Narrative   Not on file   Social Determinants of Health   Financial Resource Strain: Not on file  Food Insecurity: Not on file  Transportation Needs: Not on file  Physical Activity: Not on file  Stress: Not on file  Social Connections: Not on file  Intimate Partner Violence: Not on file   Family Status  Relation Name Status   Mother  Alive   Father  Deceased at age 11   Brother  Alive   MGM  Deceased   MGF  Deceased   PGM  Deceased   PGF  Deceased   Pat Aunt  Deceased   Pat Aunt  Alive   Neg Hx  (Not Specified)   Family History  Problem Relation Age of Onset   Congestive Heart Failure Father    Kidney failure Father    Diabetes Mellitus II Paternal Aunt    AAA (abdominal aortic aneurysm) Paternal Aunt    Stroke Neg Hx    Heart disease Neg Hx    Mental illness Neg  Hx    No Known Allergies    Review of Systems  Constitutional:  Negative for fever and malaise/fatigue.  HENT:  Negative for congestion.   Eyes:  Negative for blurred vision.  Respiratory:  Negative for shortness of breath.   Cardiovascular:  Negative for chest pain, palpitations and leg swelling.  Gastrointestinal:  Negative for abdominal pain, blood in stool and nausea.  Genitourinary:  Negative for dysuria and frequency.  Musculoskeletal:  Negative for falls.  Skin:  Negative for rash.  Neurological:  Negative for dizziness, loss of consciousness and headaches.  Endo/Heme/Allergies:  Negative for environmental allergies.  Psychiatric/Behavioral:  Negative for depression. The patient is not nervous/anxious.       Objective:     BP 116/84 (BP Location: Left Arm, Patient Position: Sitting, Cuff Size: Large)   Pulse 85   Temp 98 F (36.7 C) (Oral)   Resp 18   Ht 5\' 5"  (1.651 m)    Wt 207 lb 9.6 oz (94.2 kg)   SpO2 100%   BMI 34.55 kg/m  BP Readings from Last 3 Encounters:  09/21/21 116/84  04/02/21 124/88  03/02/21 126/84   Wt Readings from Last 3 Encounters:  09/21/21 207 lb 9.6 oz (94.2 kg)  04/02/21 203 lb 6.4 oz (92.3 kg)  03/02/21 202 lb (91.6 kg)   SpO2 Readings from Last 3 Encounters:  09/21/21 100%  04/02/21 100%  03/02/21 100%      Physical Exam Vitals and nursing note reviewed.  Constitutional:      Appearance: She is well-developed.  HENT:     Head: Normocephalic and atraumatic.  Eyes:     Conjunctiva/sclera: Conjunctivae normal.  Neck:     Thyroid: No thyromegaly.     Vascular: No carotid bruit or JVD.  Cardiovascular:     Rate and Rhythm: Normal rate and regular rhythm.     Heart sounds: Normal heart sounds. No murmur heard. Pulmonary:     Effort: Pulmonary effort is normal. No respiratory distress.     Breath sounds: Normal breath sounds. No wheezing or rales.  Chest:     Chest wall: No tenderness.  Musculoskeletal:     Cervical back: Normal range of motion and neck supple.  Neurological:     Mental Status: She is alert and oriented to person, place, and time.      Results for orders placed or performed in visit on 09/21/21  POCT Urinalysis Dipstick (Automated)  Result Value Ref Range   Color, UA yellow    Clarity, UA clear    Glucose, UA Negative Negative   Bilirubin, UA negative    Ketones, UA negative    Spec Grav, UA >=1.030 (A) 1.010 - 1.025   Blood, UA negative    pH, UA 5.0 5.0 - 8.0   Protein, UA Negative Negative   Urobilinogen, UA 0.2 0.2 or 1.0 E.U./dL   Nitrite, UA negative    Leukocytes, UA Negative Negative    Last CBC Lab Results  Component Value Date   WBC 8.3 04/02/2021   HGB 11.5 (L) 04/02/2021   HCT 34.8 (L) 04/02/2021   MCV 89.5 04/02/2021   MCH 29.6 04/02/2021   RDW 14.8 04/02/2021   PLT 209 04/02/2021   Last metabolic panel Lab Results  Component Value Date   GLUCOSE 94  04/02/2021   NA 138 04/02/2021   K 4.2 04/02/2021   CL 104 04/02/2021   CO2 23 04/02/2021   BUN 9 04/02/2021   CREATININE 0.79  04/02/2021   GFRNONAA 94 01/02/2020   CALCIUM 9.1 04/02/2021   PROT 7.3 04/02/2021   ALBUMIN 3.9 09/24/2019   BILITOT 0.3 04/02/2021   ALKPHOS 40 09/24/2019   AST 12 04/02/2021   ALT 12 04/02/2021   ANIONGAP 5 07/19/2014   Last lipids Lab Results  Component Value Date   CHOL 188 04/02/2021   HDL 57 04/02/2021   LDLCALC 117 (H) 04/02/2021   TRIG 51 04/02/2021   CHOLHDL 3.3 04/02/2021   Last hemoglobin A1c Lab Results  Component Value Date   HGBA1C 5.2 04/02/2021   Last thyroid functions Lab Results  Component Value Date   TSH 1.08 04/02/2021   Last vitamin D Lab Results  Component Value Date   VD25OH 19 (L) 04/02/2021   Last vitamin B12 and Folate Lab Results  Component Value Date   VITAMINB12 323 04/02/2021      The 10-year ASCVD risk score (Arnett DK, et al., 2019) is: 0.5%    Assessment & Plan:   Problem List Items Addressed This Visit       Unprioritized   Morbid obesity (HCC)    With insulin resistance Pt insurance will not pay for weight loss meds We do not have sample but unable to give her sample even if we did due to no coverage Pt not interested in HWW or working out at this time       Relevant Medications   Liraglutide -Weight Management (SAXENDA) 18 MG/3ML SOPN   Dysuria - Primary    ua normal       Relevant Orders   POCT Urinalysis Dipstick (Automated) (Completed)   Other Visit Diagnoses     Insulin resistance       Relevant Medications   Liraglutide -Weight Management (SAXENDA) 18 MG/3ML SOPN       Return if symptoms worsen or fail to improve.    Donato Schultz, DO

## 2021-09-22 ENCOUNTER — Ambulatory Visit: Payer: BC Managed Care – PPO | Admitting: Medical

## 2021-09-24 DIAGNOSIS — R300 Dysuria: Secondary | ICD-10-CM | POA: Insufficient documentation

## 2021-09-24 NOTE — Assessment & Plan Note (Signed)
ua normal ?

## 2021-09-24 NOTE — Assessment & Plan Note (Signed)
With insulin resistance Pt insurance will not pay for weight loss meds We do not have sample but unable to give her sample even if we did due to no coverage Pt not interested in HWW or working out at this time

## 2021-10-25 ENCOUNTER — Ambulatory Visit: Payer: Self-pay | Admitting: Neurology

## 2021-10-25 ENCOUNTER — Encounter: Payer: Self-pay | Admitting: Neurology

## 2021-10-25 DIAGNOSIS — Z029 Encounter for administrative examinations, unspecified: Secondary | ICD-10-CM

## 2022-02-09 ENCOUNTER — Telehealth: Payer: Self-pay

## 2022-02-09 ENCOUNTER — Ambulatory Visit (INDEPENDENT_AMBULATORY_CARE_PROVIDER_SITE_OTHER): Payer: BC Managed Care – PPO | Admitting: Medical

## 2022-02-09 VITALS — BP 128/65 | HR 90 | Resp 18 | Ht 65.0 in | Wt 214.6 lb

## 2022-02-09 DIAGNOSIS — E669 Obesity, unspecified: Secondary | ICD-10-CM | POA: Diagnosis not present

## 2022-02-09 DIAGNOSIS — R739 Hyperglycemia, unspecified: Secondary | ICD-10-CM

## 2022-02-09 DIAGNOSIS — R21 Rash and other nonspecific skin eruption: Secondary | ICD-10-CM | POA: Diagnosis not present

## 2022-02-09 DIAGNOSIS — R102 Pelvic and perineal pain: Secondary | ICD-10-CM | POA: Diagnosis not present

## 2022-02-09 LAB — POC URINALSYSI DIPSTICK (AUTOMATED)
Bilirubin, UA: NEGATIVE
Blood, UA: NEGATIVE
Glucose, UA: NEGATIVE
Ketones, UA: NEGATIVE
Leukocytes, UA: NEGATIVE
Nitrite, UA: NEGATIVE
Protein, UA: NEGATIVE
Spec Grav, UA: 1.01 (ref 1.010–1.025)
Urobilinogen, UA: 0.2 E.U./dL
pH, UA: 5 (ref 5.0–8.0)

## 2022-02-09 MED ORDER — METHYLPREDNISOLONE 4 MG PO TABS: ORAL_TABLET | ORAL | 0 refills | Status: DC

## 2022-02-09 MED ORDER — AMMONIUM LACTATE 12 % EX LOTN: 1.0000 | TOPICAL_LOTION | CUTANEOUS | 0 refills | Status: DC | PRN

## 2022-02-09 NOTE — Patient Instructions (Addendum)
Scattered areas of rash present for months.  Did not respond to topical antifungal or medium potency steroid in the past.  On review of skin today and by history rash appears more lichenified particularly the right distal calf area.  This skin feels also dry.  I think at this point is reasonable to refer you to a dermatologist.  It appears your PCP did attempt that.  Placing the referral today and asked that you give me an update as to appointment timeframe.  If again given appointment 8 months out then  will try to get you a more reasonable timeframe such as a month.  Considering the possibility of eczema/psoriasis type rash.  Will prescribe a very low-dose Medrol for 4 days and prescribe Lac-Hydrin to use twice a day.  Update me in a week asked whether or not rash is some improved.  Elevated sugar and obesity.  We will get A1c and metabolic panel today.  If A1c is normal would try to prescribe Saxenda.  If A1c is elevated we will try to prescribe Ozempic.  For slight minimal suprapubic tenderness twice in the past week did not get urinalysis and will send a urine culture out.  If positive for infection will prescribe antibiotic.  Follow-up date to be determined after lab review.

## 2022-02-09 NOTE — Addendum Note (Signed)
Addended by: Rosita Kea on: 02/09/2022 04:33 PM   Modules accepted: Orders

## 2022-02-09 NOTE — Progress Notes (Addendum)
Subjective:    Patient ID: Veronica Stanton, female    DOB: [birth year], 41 y.o.   MRN: [id number]  HPI  Pt in for recent rash. Most recent rash has been on lateral aspect of rt distal calf area. Pt states these rashes occur sporadically in different regions. Sometimes will ocure on upper thighs. Also some lighter rash region on rt upper calf.  Rash started back in November to thighs then gradualy worsened and still persists.  Pt in the past she used triamcinolone but area did not get better.   Pt uses nivia moisturizer. Pt states has vitamin E in it.  Pt stats these area never itceh.   Some frequent urine in past. Last week had some faint discomfort over suprapbuic area lat week and again earlier this week. Took azostandard one time last week and again this week. Both times seemed to help.  Elevated sugar in past.  Pt also states wants to loose weight. Describes struggles losing in the past and iinsurance coverage issues/cost of saxendsa.    Review of Systems  Constitutional:  Negative for chills, fatigue and fever.  Respiratory:  Negative for cough, chest tightness, shortness of breath and wheezing.   Cardiovascular:  Negative for chest pain and palpitations.  Gastrointestinal:  Negative for abdominal pain and blood in stool.  Musculoskeletal:  Negative for back pain and myalgias.  Psychiatric/Behavioral:  Negative for behavioral problems and confusion.    Past Medical History:  Diagnosis Date   No diagnosis    Palpitations      Social History   Socioeconomic History   Marital status: Single    Spouse name: Not on file   Number of children: Not on file   Years of education: Not on file   Highest education level: Not on file  Occupational History   Not on file  Tobacco Use   Smoking status: Never   Smokeless tobacco: Never  Vaping Use   Vaping Use: Never used  Substance and Sexual Activity   Alcohol use: No   Drug use: No   Sexual activity: Not on file   Other Topics Concern   Not on file  Social History Narrative   Not on file   Social Determinants of Health   Financial Resource Strain: Not on file  Food Insecurity: Not on file  Transportation Needs: Not on file  Physical Activity: Not on file  Stress: Not on file  Social Connections: Not on file  Intimate Partner Violence: Not on file    Past Surgical History:  Procedure Laterality Date   negative      Family History  Problem Relation Age of Onset   Congestive Heart Failure Father    Kidney failure Father    Diabetes Mellitus II Paternal Aunt    AAA (abdominal aortic aneurysm) Paternal Aunt    Stroke Neg Hx    Heart disease Neg Hx    Mental illness Neg Hx     No Known Allergies  Current Outpatient Medications on File Prior to Visit  Medication Sig Dispense Refill   clonazePAM (KLONOPIN) 0.5 MG tablet Take 1 tablet (0.5 mg total) by mouth 2 (two) times daily as needed for anxiety. 14 tablet 0   clotrimazole-betamethasone (LOTRISONE) cream Apply 1 application topically daily. 30 g 0   ergocalciferol (VITAMIN D2) 1.25 MG (50000 UT) capsule Take 1 capsule (50,000 Units total) by mouth once a week. 12 capsule 1   fluticasone (FLONASE) 50 MCG/ACT nasal spray Place 2  sprays into both nostrils daily. 16 g 6   Insulin Pen Needle (NOVOFINE PEN NEEDLE) 32G X 6 MM MISC As directed 100 each 1   Liraglutide -Weight Management (SAXENDA) 18 MG/3ML SOPN Inject 3 mg into the skin daily. 3 mL 2   loratadine (CLARITIN) 10 MG tablet Take 1 tablet (10 mg total) by mouth daily. 30 tablet 11   metFORMIN (GLUCOPHAGE) 500 MG tablet Take 1 tablet (500 mg total) by mouth daily with breakfast. 30 tablet 2   topiramate (TOPAMAX) 25 MG tablet Take 3 tablets daily 270 tablet 0   triamcinolone cream (KENALOG) 0.1 % Apply 1 application topically 2 (two) times daily. 30 g 0   No current facility-administered medications on file prior to visit.    BP 128/65   Pulse 90   Resp 18   Ht 5\' 5"  (1.651  m)   Wt 214 lb 9.6 oz (97.3 kg)   SpO2 100%   BMI 35.71 kg/m        Objective:   Physical Exam  General- No acute distress. Pleasant patient. Neck- Full range of motion, no jvd Lungs- Clear, even and unlabored. Heart- regular rate and rhythm. Neurologic- CNII- XII grossly intact.   Derm- rt lateral distal call 2.5 cm wide dark hyperpigmented rash feels lichinified Other area similar but not as prominent but scattered upper thigh, back of left leg and proximal rt calf.    Assessment & Plan:   Patient Instructions  Scattered areas of rash present for months.  Did not respond to topical antifungal or medium potency steroid in the past.  On review of skin today and by history rash appears more lichenified particularly the right distal calf area.  This skin feels also dry.  I think at this point is reasonable to refer you to a dermatologist.  It appears your PCP did attempt that.  Placing the referral today and asked that you give me an update as to appointment timeframe.  If again given appointment 8 months out then  will try to get you a more reasonable timeframe such as a month.  Considering the possibility of eczema/psoriasis type rash.  Will prescribe a very low-dose Medrol for 4 days and prescribe Lac-Hydrin to use twice a day.  Update me in a week asked whether or not rash is some improved.  Elevated sugar and obesity.  We will get A1c and metabolic panel today.  If A1c is normal would try to prescribe Saxenda.  If A1c is elevated we will try to prescribe Ozempic.  For slight minimal suprapubic tenderness twice in the past week did not get urinalysis and will send a urine culture out.  If positive for infection will prescribe antibiotic.  Follow-up date to be determined after lab review.   , PA-C   Time spent with patient today was 40  minutes which consisted of chart review, discussing diagnosis, work up ,treatment, answering pt question on dx/tx and documentation.

## 2022-02-09 NOTE — Telephone Encounter (Signed)
Pt was seen today Veronica Stanton , she wanted a substitute for metformin because she stated she is unable to take her topiramate .Marland Kitchen Veronica Stanton advised her that some medications wont be covered due to insurance and Metformin is cheaper and helps with insulin levels and also weight loss , pt stated she wants to still try something different since the metformin was called in based off past labs and wants to continue her topiramate.

## 2022-02-10 LAB — COMPREHENSIVE METABOLIC PANEL
ALT: 12 U/L (ref 0–35)
AST: 13 U/L (ref 0–37)
Albumin: 3.9 g/dL (ref 3.5–5.2)
Alkaline Phosphatase: 37 U/L — ABNORMAL LOW (ref 39–117)
BUN: 6 mg/dL (ref 6–23)
CO2: 29 mEq/L (ref 19–32)
Calcium: 9 mg/dL (ref 8.4–10.5)
Chloride: 102 mEq/L (ref 96–112)
Creatinine, Ser: 0.77 mg/dL (ref 0.40–1.20)
GFR: 94.44 mL/min (ref >60.00)
Glucose, Bld: 89 mg/dL (ref 70–99)
Potassium: 4.3 mEq/L (ref 3.5–5.1)
Sodium: 136 mEq/L (ref 135–145)
Total Bilirubin: 0.3 mg/dL (ref 0.2–1.2)
Total Protein: 7.3 g/dL (ref 6.0–8.3)

## 2022-02-10 LAB — HEMOGLOBIN A1C: Hgb A1c MFr Bld: 5.6 % (ref 4.6–6.5)

## 2022-02-10 NOTE — Telephone Encounter (Signed)
Pt was told yesterday verbally medication can be discussed after A1c results are back by myself as well

## 2022-02-11 LAB — URINE CULTURE
MICRO NUMBER:: 14192983
SPECIMEN QUALITY:: ADEQUATE

## 2022-02-11 MED ORDER — SAXENDA 18 MG/3ML ~~LOC~~ SOPN: 1.8000 mg | PEN_INJECTOR | Freq: Every day | SUBCUTANEOUS | 0 refills | Status: DC

## 2022-02-11 NOTE — Addendum Note (Signed)
Addended by: Gwenevere Abbot on: 02/11/2022 06:04 AM   Modules accepted: Orders

## 2022-02-15 ENCOUNTER — Telehealth: Payer: Self-pay | Admitting: Family Medicine

## 2022-02-15 NOTE — Telephone Encounter (Addendum)
Pt called to follow up on dermatology referral. Advised pt that referral was sent to [facility]. Pt stated she would like to have that referral rerouted as she has had a bad experience with their practice. Pt would like a call to advise of where referral is being sent.

## 2022-02-15 NOTE — Telephone Encounter (Signed)
Pt called and advised of where referral was rerouted to.

## 2022-03-03 DIAGNOSIS — L308 Other specified dermatitis: Secondary | ICD-10-CM | POA: Diagnosis not present

## 2022-05-17 ENCOUNTER — Other Ambulatory Visit: Payer: Self-pay | Admitting: Family Medicine

## 2022-06-29 ENCOUNTER — Telehealth: Payer: Self-pay

## 2022-06-29 NOTE — Telephone Encounter (Signed)
Needs updated OV for weight and BMI for Saxenda PA please.

## 2022-09-13 ENCOUNTER — Ambulatory Visit (INDEPENDENT_AMBULATORY_CARE_PROVIDER_SITE_OTHER): Payer: BC Managed Care – PPO | Admitting: Family Medicine

## 2022-09-13 ENCOUNTER — Encounter: Payer: Self-pay | Admitting: Family Medicine

## 2022-09-13 VITALS — BP 120/80 | HR 98 | Temp 98.2°F | Resp 18 | Ht 65.0 in | Wt 211.8 lb

## 2022-09-13 DIAGNOSIS — E88819 Insulin resistance, unspecified: Secondary | ICD-10-CM

## 2022-09-13 DIAGNOSIS — E559 Vitamin D deficiency, unspecified: Secondary | ICD-10-CM | POA: Diagnosis not present

## 2022-09-13 DIAGNOSIS — Z6830 Body mass index (BMI) 30.0-30.9, adult: Secondary | ICD-10-CM | POA: Diagnosis not present

## 2022-09-13 DIAGNOSIS — E669 Obesity, unspecified: Secondary | ICD-10-CM | POA: Diagnosis not present

## 2022-09-13 MED ORDER — ZEPBOUND 2.5 MG/0.5ML ~~LOC~~ SOAJ: 2.5000 mg | SUBCUTANEOUS | 0 refills | Status: DC

## 2022-09-13 NOTE — Progress Notes (Signed)
Established Patient Office Visit  Subjective   Patient ID: Veronica Stanton, female    DOB: October 13, 1980  Age: 42 y.o. MRN: [id number]  Chief Complaint  Patient presents with   Insect Bite    Pt states she went to an event in the park. Pt states she started seeing bugs on her skin. Pt states having small bumps on her arms and legs. Pt thinks she caught something from somebody    HPI Discussed the use of AI scribe software for clinical note transcription with the patient, who gave verbal consent to proceed.  History of Present Illness   The patient presents with a pruritic rash that developed after a park visit one week ago. She noticed small mites on her clothing and in her home, and suspects she may have been exposed to bird mites. The rash is located on her leg and back. She has been applying Benadryl cream, but it has not alleviated the itching. Pest control has sprayed her home, but she was told that the issue may be bird mites in the attic. The patient is awaiting further pest control services to inspect the attic. Her daughter has not developed a rash, but the patient has noticed the small bugs in her room as well.  The patient also reports a previous low vitamin D level and is not currently taking any supplements. She was previously prescribed metformin, but it did not interact well with topiramate. She is also seeking assistance with weight management.      Patient Active Problem List   Diagnosis Date Noted   Dysuria 09/24/2021   High risk heterosexual behavior 04/02/2021   Morbid obesity (HCC) 04/02/2021   Hyperpigmentation 04/02/2021   Eczema 03/03/2021   COVID-19 11/24/2020   Paroxysmal tachycardia (HCC) 06/15/2020   Lower extremity edema 06/15/2020   Obesity (BMI 30.0-34.9) 10/21/2019   History of seizures 01/03/2019   Altered level of consciousness 02/16/2018   Left arm weakness 06/08/2016   Localization-related idiopathic epilepsy and epileptic syndromes with seizures  of localized onset, not intractable, without status epilepticus (HCC) 09/02/2014   Awareness alteration, transient 07/30/2014   Faintness 07/30/2014   Syncope 07/28/2014   Rapid palpitations 11/16/2011   Dyspnea 11/16/2011   Past Medical History:  Diagnosis Date   No diagnosis    Palpitations    Past Surgical History:  Procedure Laterality Date   negative     Social History   Tobacco Use   Smoking status: Never   Smokeless tobacco: Never  Vaping Use   Vaping Use: Never used  Substance Use Topics   Alcohol use: No   Drug use: No   Social History   Socioeconomic History   Marital status: Single    Spouse name: Not on file   Number of children: Not on file   Years of education: Not on file   Highest education level: Not on file  Occupational History   Not on file  Tobacco Use   Smoking status: Never   Smokeless tobacco: Never  Vaping Use   Vaping Use: Never used  Substance and Sexual Activity   Alcohol use: No   Drug use: No   Sexual activity: Not on file  Other Topics Concern   Not on file  Social History Narrative   Not on file   Social Determinants of Health   Financial Resource Strain: Not on file  Food Insecurity: Not on file  Transportation Needs: Not on file  Physical Activity: Not on file  Stress: Not on file  Social Connections: Not on file  Intimate Partner Violence: Not on file   Family Status  Relation Name Status   Mother  Alive   Father  Deceased at age 79   Brother  Alive   MGM  Deceased   MGF  Deceased   PGM  Deceased   PGF  Deceased   Pat Aunt  Deceased   Pat Aunt  Alive   Neg Hx  (Not Specified)   Family History  Problem Relation Age of Onset   Congestive Heart Failure Father    Kidney failure Father    Diabetes Mellitus II Paternal Aunt    AAA (abdominal aortic aneurysm) Paternal Aunt    Stroke Neg Hx    Heart disease Neg Hx    Mental illness Neg Hx    No Known Allergies    Review of Systems  Constitutional:   Negative for chills, fever and malaise/fatigue.  HENT:  Negative for congestion and hearing loss.   Eyes:  Negative for blurred vision and discharge.  Respiratory:  Negative for cough, sputum production and shortness of breath.   Cardiovascular:  Negative for chest pain, palpitations and leg swelling.  Gastrointestinal:  Negative for abdominal pain, blood in stool, constipation, diarrhea, heartburn, nausea and vomiting.  Genitourinary:  Negative for dysuria, frequency, hematuria and urgency.  Musculoskeletal:  Negative for back pain, falls and myalgias.  Skin:  Negative for rash.  Neurological:  Negative for dizziness, sensory change, loss of consciousness, weakness and headaches.  Endo/Heme/Allergies:  Negative for environmental allergies. Does not bruise/bleed easily.  Psychiatric/Behavioral:  Negative for depression and suicidal ideas. The patient is not nervous/anxious and does not have insomnia.       Objective:     BP 120/80 (BP Location: Left Arm, Patient Position: Sitting, Cuff Size: Large)   Pulse 98   Temp 98.2 F (36.8 C) (Oral)   Resp 18   Ht 5\' 5"  (1.651 m)   Wt 211 lb 12.8 oz (96.1 kg)   SpO2 97%   BMI 35.25 kg/m  BP Readings from Last 3 Encounters:  09/13/22 120/80  02/09/22 128/65  09/21/21 116/84   Wt Readings from Last 3 Encounters:  09/13/22 211 lb 12.8 oz (96.1 kg)  02/09/22 214 lb 9.6 oz (97.3 kg)  09/21/21 207 lb 9.6 oz (94.2 kg)   SpO2 Readings from Last 3 Encounters:  09/13/22 97%  02/09/22 100%  09/21/21 100%      Physical Exam Vitals and nursing note reviewed.  Constitutional:      General: She is not in acute distress.    Appearance: She is well-developed. She is not diaphoretic.  HENT:     Head: Normocephalic and atraumatic.     Right Ear: External ear normal.     Left Ear: External ear normal.     Nose: Nose normal.  Eyes:     General:        Right eye: No discharge.        Left eye: No discharge.     Conjunctiva/sclera:  Conjunctivae normal.     Pupils: Pupils are equal, round, and reactive to light.  Neck:     Thyroid: No thyromegaly.     Vascular: No JVD.  Cardiovascular:     Rate and Rhythm: Normal rate and regular rhythm.     Heart sounds: Normal heart sounds. No murmur heard. Pulmonary:     Effort: Pulmonary effort is normal. No respiratory distress.  Breath sounds: Normal breath sounds. No wheezing or rales.  Chest:     Chest wall: No tenderness.  Abdominal:     General: Bowel sounds are normal. There is no distension.     Palpations: Abdomen is soft. There is no mass.     Tenderness: There is no abdominal tenderness. There is no guarding or rebound.  Musculoskeletal:        General: No tenderness. Normal range of motion.     Cervical back: Normal range of motion and neck supple.  Lymphadenopathy:     Cervical: No cervical adenopathy.  Skin:    General: Skin is warm and dry.     Findings: No erythema or rash.  Neurological:     Mental Status: She is alert and oriented to person, place, and time.     Cranial Nerves: No cranial nerve deficit.     Deep Tendon Reflexes: Reflexes are normal and symmetric.  Psychiatric:        Behavior: Behavior normal.        Thought Content: Thought content normal.        Judgment: Judgment normal.      No results found for any visits on 09/13/22.  Last CBC Lab Results  Component Value Date   WBC 8.3 04/02/2021   HGB 11.5 (L) 04/02/2021   HCT 34.8 (L) 04/02/2021   MCV 89.5 04/02/2021   MCH 29.6 04/02/2021   RDW 14.8 04/02/2021   PLT 209 04/02/2021   Last metabolic panel Lab Results  Component Value Date   GLUCOSE 89 02/09/2022   NA 136 02/09/2022   K 4.3 02/09/2022   CL 102 02/09/2022   CO2 29 02/09/2022   BUN 6 02/09/2022   CREATININE 0.77 02/09/2022   GFRNONAA 94 01/02/2020   CALCIUM 9.0 02/09/2022   PROT 7.3 02/09/2022   ALBUMIN 3.9 02/09/2022   BILITOT 0.3 02/09/2022   ALKPHOS 37 (L) 02/09/2022   AST 13 02/09/2022   ALT 12  02/09/2022   ANIONGAP 5 07/19/2014   Last lipids Lab Results  Component Value Date   CHOL 188 04/02/2021   HDL 57 04/02/2021   LDLCALC 117 (H) 04/02/2021   TRIG 51 04/02/2021   CHOLHDL 3.3 04/02/2021   Last hemoglobin A1c Lab Results  Component Value Date   HGBA1C 5.6 02/09/2022   Last thyroid functions Lab Results  Component Value Date   TSH 1.08 04/02/2021   Last vitamin D Lab Results  Component Value Date   VD25OH 19 (L) 04/02/2021   Last vitamin B12 and Folate Lab Results  Component Value Date   VITAMINB12 323 04/02/2021      The 10-year ASCVD risk score (Arnett DK, et al., 2019) is: 0.6%    Assessment & Plan:   Problem List Items Addressed This Visit       Unprioritized   Obesity (BMI 30.0-34.9)   Relevant Orders   Lipid panel   CBC with Differential/Platelet   Comprehensive metabolic panel   Hemoglobin A1c   VITAMIN D 25 Hydroxy (Vit-D Deficiency, Fractures)   Other Visit Diagnoses     Insulin resistance    -  Primary   Relevant Orders   Lipid panel   CBC with Differential/Platelet   Comprehensive metabolic panel   Hemoglobin A1c   VITAMIN D 25 Hydroxy (Vit-D Deficiency, Fractures)   Vitamin D deficiency       Relevant Orders   VITAMIN D 25 Hydroxy (Vit-D Deficiency, Fractures)   Obesity (BMI 35.0-39.9 without  comorbidity)       Relevant Medications   tirzepatide (ZEPBOUND) 2.5 MG/0.5ML Pen     Assessment and Plan    Insect Bites: Pruritic rash likely secondary to insect bites, possibly mites. Patient has seen bugs in her home and is arranging for pest control. No other household members affected. -Continue Benadryl cream for local relief. -Start Claritin in the morning and Benadryl at night for systemic relief. -Address root cause with pest control.  Weight Gain: Patient expressed concern about recent weight gain and interest in weight loss medication. -Start Zepbound (once weekly) with a plan to increase dose after one  month. -Patient to check for coupons online due to high cost of medication.  Vitamin D Deficiency: Patient has a history of Vitamin D deficiency but is not currently on supplementation. -Advise patient to start over-the-counter Vitamin D3 daily.  General Health Maintenance: -Order lab work to check Vitamin D levels and other routine health maintenance. -Schedule follow-up appointment in three months.        No follow-ups on file.    Donato Schultz, DO

## 2022-09-14 LAB — LIPID PANEL
Cholesterol: 176 mg/dL (ref 0–200)
HDL: 43.9 mg/dL (ref >39.00)
LDL Cholesterol: 118 mg/dL — ABNORMAL HIGH (ref 0–99)
NonHDL: 131.75
Total CHOL/HDL Ratio: 4
Triglycerides: 69 mg/dL (ref 0.0–149.0)
VLDL: 13.8 mg/dL (ref 0.0–40.0)

## 2022-09-14 LAB — CBC WITH DIFFERENTIAL/PLATELET
Basophils Absolute: 0.1 10*3/uL (ref 0.0–0.1)
Basophils Relative: 0.8 % (ref 0.0–3.0)
Eosinophils Absolute: 0.1 10*3/uL (ref 0.0–0.7)
Eosinophils Relative: 1.6 % (ref 0.0–5.0)
HCT: 38.2 % (ref 36.0–46.0)
Hemoglobin: 12.6 g/dL (ref 12.0–15.0)
Lymphocytes Relative: 34.2 % (ref 12.0–46.0)
Lymphs Abs: 2.8 10*3/uL (ref 0.7–4.0)
MCHC: 33 g/dL (ref 30.0–36.0)
MCV: 92.7 fl (ref 78.0–100.0)
Monocytes Absolute: 0.7 10*3/uL (ref 0.1–1.0)
Monocytes Relative: 8.5 % (ref 3.0–12.0)
Neutro Abs: 4.5 10*3/uL (ref 1.4–7.7)
Neutrophils Relative %: 54.9 % (ref 43.0–77.0)
Platelets: 160 10*3/uL (ref 150.0–400.0)
RBC: 4.12 Mil/uL (ref 3.87–5.11)
RDW: 14.6 % (ref 11.5–15.5)
WBC: 8.2 10*3/uL (ref 4.0–10.5)

## 2022-09-14 LAB — VITAMIN D 25 HYDROXY (VIT D DEFICIENCY, FRACTURES): VITD: 14.97 ng/mL — ABNORMAL LOW (ref 30.00–100.00)

## 2022-09-14 LAB — COMPREHENSIVE METABOLIC PANEL
ALT: 13 U/L (ref 0–35)
AST: 13 U/L (ref 0–37)
Albumin: 3.8 g/dL (ref 3.5–5.2)
Alkaline Phosphatase: 37 U/L — ABNORMAL LOW (ref 39–117)
BUN: 10 mg/dL (ref 6–23)
CO2: 26 mEq/L (ref 19–32)
Calcium: 8.8 mg/dL (ref 8.4–10.5)
Chloride: 103 mEq/L (ref 96–112)
Creatinine, Ser: 0.75 mg/dL (ref 0.40–1.20)
GFR: 97.07 mL/min (ref >60.00)
Glucose, Bld: 86 mg/dL (ref 70–99)
Potassium: 4.2 mEq/L (ref 3.5–5.1)
Sodium: 136 mEq/L (ref 135–145)
Total Bilirubin: 0.3 mg/dL (ref 0.2–1.2)
Total Protein: 7.2 g/dL (ref 6.0–8.3)

## 2022-09-14 LAB — HEMOGLOBIN A1C: Hgb A1c MFr Bld: 5.4 % (ref 4.6–6.5)

## 2022-09-15 ENCOUNTER — Telehealth: Payer: Self-pay | Admitting: Family Medicine

## 2022-09-15 NOTE — Telephone Encounter (Signed)
Pt called to check on status on PA for her zepbound. Pt stated that pharmacy had sent over the info via fax on 6.18.24. Advised pt that no note was seen in regards to status and a note would be sent back to look into this matter.

## 2022-09-16 NOTE — Addendum Note (Signed)
Addended by: Thelma Barge D on: 09/16/2022 01:04 PM   Modules accepted: Orders

## 2022-09-16 NOTE — Telephone Encounter (Addendum)
Prior auth started via cover my meds.  Awaiting determination.  Key: [id number]

## 2022-09-19 ENCOUNTER — Other Ambulatory Visit: Payer: Self-pay | Admitting: *Deleted

## 2022-09-19 MED ORDER — VITAMIN D (ERGOCALCIFEROL) 1.25 MG (50000 UNIT) PO CAPS: 50000.0000 [IU] | ORAL_CAPSULE | ORAL | 0 refills | Status: DC

## 2022-09-19 NOTE — Telephone Encounter (Signed)
Approved today Approved. Authorization Expiration Date: 01/20/2023

## 2022-10-20 ENCOUNTER — Other Ambulatory Visit: Payer: Self-pay | Admitting: Family Medicine

## 2022-10-20 ENCOUNTER — Telehealth: Payer: Self-pay | Admitting: Family Medicine

## 2022-10-20 MED ORDER — ZEPBOUND 5 MG/0.5ML ~~LOC~~ SOAJ: 5.0000 mg | SUBCUTANEOUS | 1 refills | Status: DC

## 2022-10-20 NOTE — Telephone Encounter (Signed)
Would you like to increase?  

## 2022-10-20 NOTE — Telephone Encounter (Signed)
Medication: tirzepatide (ZEPBOUND) 2.5 MG/0.5ML Pen. She would like to know if she is increasing dose.   Has the patient contacted their pharmacy? Yes.     Preferred Pharmacy:  [facility] [address] [place], [place] [address]

## 2022-10-21 NOTE — Telephone Encounter (Signed)
Rx increased.

## 2022-11-10 ENCOUNTER — Encounter (INDEPENDENT_AMBULATORY_CARE_PROVIDER_SITE_OTHER): Payer: Self-pay

## 2022-11-24 ENCOUNTER — Encounter: Payer: Self-pay | Admitting: Family

## 2022-11-24 ENCOUNTER — Ambulatory Visit: Payer: BC Managed Care – PPO | Admitting: Family

## 2022-11-24 VITALS — BP 112/68 | HR 97 | Resp 18 | Ht 65.0 in | Wt 195.4 lb

## 2022-11-24 DIAGNOSIS — Z0289 Encounter for other administrative examinations: Secondary | ICD-10-CM

## 2022-11-24 DIAGNOSIS — E88819 Insulin resistance, unspecified: Secondary | ICD-10-CM | POA: Diagnosis not present

## 2022-11-24 DIAGNOSIS — Z6832 Body mass index (BMI) 32.0-32.9, adult: Secondary | ICD-10-CM

## 2022-11-24 NOTE — Progress Notes (Signed)
Veronica Stanton is a 42 y.o. female with the following history as recorded in EpicCare:  Patient Active Problem List   Diagnosis Date Noted   Dysuria 09/24/2021   High risk heterosexual behavior 04/02/2021   Morbid obesity (HCC) 04/02/2021   Hyperpigmentation 04/02/2021   Eczema 03/03/2021   COVID-19 11/24/2020   Paroxysmal tachycardia (HCC) 06/15/2020   Lower extremity edema 06/15/2020   Obesity (BMI 30.0-34.9) 10/21/2019   History of seizures 01/03/2019   Altered level of consciousness 02/16/2018   Left arm weakness 06/08/2016   Localization-related idiopathic epilepsy and epileptic syndromes with seizures of localized onset, not intractable, without status epilepticus (HCC) 09/02/2014   Awareness alteration, transient 07/30/2014   Faintness 07/30/2014   Syncope 07/28/2014   Rapid palpitations 11/16/2011   Dyspnea 11/16/2011    Current Outpatient Medications  Medication Sig Dispense Refill   ammonium lactate (AMLACTIN) 12 % lotion Apply 1 Application topically as needed for dry skin (apply thin film twice daily.). 400 g 0   clonazePAM (KLONOPIN) 0.5 MG tablet Take 1 tablet (0.5 mg total) by mouth 2 (two) times daily as needed for anxiety. 14 tablet 0   clotrimazole-betamethasone (LOTRISONE) cream Apply 1 application topically daily. 30 g 0   fluticasone (FLONASE) 50 MCG/ACT nasal spray Place 2 sprays into both nostrils daily. 16 g 6   loratadine (CLARITIN) 10 MG tablet Take 1 tablet (10 mg total) by mouth daily. 30 tablet 11   metFORMIN (GLUCOPHAGE) 500 MG tablet Take 1 tablet (500 mg total) by mouth daily with breakfast. 30 tablet 2   methylPREDNISolone (MEDROL) 4 MG tablet 4 tab po day 1, 3 tab po day 2, 2 tab po day 3, 1 tab po day 4 10 tablet 0   tirzepatide (ZEPBOUND) 2.5 MG/0.5ML Pen Inject 2.5 mg into the skin once a week. 0.5 mL 0   tirzepatide (ZEPBOUND) 5 MG/0.5ML Pen Inject 5 mg into the skin once a week. 3 mL 1   topiramate (TOPAMAX) 25 MG tablet Take 3 tablets daily  270 tablet 0   triamcinolone cream (KENALOG) 0.1 % Apply 1 application topically 2 (two) times daily. 30 g 0   Vitamin D, Ergocalciferol, (DRISDOL) 1.25 MG (50000 UNIT) CAPS capsule Take 1 capsule (50,000 Units total) by mouth once a week. 12 capsule 0   No current facility-administered medications for this visit.    Allergies: Patient has no known allergies.  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  Head: Normocephalic and atraumatic  Eyes: Sclera and conjunctiva clear; pupils round and reactive to light; extraocular movements intact  Ears: External normal; canals clear; tympanic membranes normal  Oropharynx: Pink, supple. No suspicious lesions  Neck: Supple without thyromegaly, adenopathy  Lungs: Respirations unlabored; clear to auscultation bilaterally without wheeze, rales, rhonchi  CVS exam: normal rate and regular rhythm.  Neurologic: Alert and oriented; speech  intact; face symmetrical; moves all extremities well; CNII-XII intact without focal deficit   Assessment:  1. BMI 32.0-32.9,adult   2. Insulin resistance   3. Encounter for physical examination related to employment     Plan:  Form completed for working as Lawyer for [employer]; PPD placed at [employer] earlier today and she will be going back to have read this weekend;  Keep planned follow up with Dr. Laury Axon next month to discuss [facility];   No follow-ups on file.  No orders of the defined types were placed in this encounter.   Requested Prescriptions    No prescriptions requested or ordered in this encounter

## 2022-12-05 ENCOUNTER — Encounter: Payer: Self-pay | Admitting: Family Medicine

## 2022-12-05 ENCOUNTER — Ambulatory Visit (INDEPENDENT_AMBULATORY_CARE_PROVIDER_SITE_OTHER): Payer: BC Managed Care – PPO | Admitting: Family Medicine

## 2022-12-05 VITALS — BP 124/80 | HR 88 | Temp 98.8°F | Resp 18 | Ht 65.0 in | Wt 191.8 lb

## 2022-12-05 DIAGNOSIS — Z6831 Body mass index (BMI) 31.0-31.9, adult: Secondary | ICD-10-CM | POA: Diagnosis not present

## 2022-12-05 DIAGNOSIS — E669 Obesity, unspecified: Secondary | ICD-10-CM | POA: Diagnosis not present

## 2022-12-05 MED ORDER — ZEPBOUND 7.5 MG/0.5ML ~~LOC~~ SOAJ
7.5000 mg | SUBCUTANEOUS | 0 refills | Status: DC
Start: 2022-12-05 — End: 2023-03-07

## 2022-12-05 NOTE — Patient Instructions (Signed)
Calorie Counting for Weight Loss Calories are units of energy. Your body needs a certain number of calories from food to keep going throughout the day. When you eat or drink more calories than your body needs, your body stores the extra calories mostly as fat. When you eat or drink fewer calories than your body needs, your body burns fat to get the energy it needs. Calorie counting means keeping track of how many calories you eat and drink each day. Calorie counting can be helpful if you need to lose weight. If you eat fewer calories than your body needs, you should lose weight. Ask your health care provider what a healthy weight is for you. For calorie counting to work, you will need to eat the right number of calories each day to lose a healthy amount of weight per week. A dietitian can help you figure out how many calories you need in a day and will suggest ways to reach your calorie goal. A healthy amount of weight to lose each week is usually 1-2 lb (0.5-0.9 kg). This usually means that your daily calorie intake should be reduced by 500-750 calories. Eating 1,200-1,500 calories a day can help most women lose weight. Eating 1,500-1,800 calories a day can help most men lose weight. What do I need to know about calorie counting? Work with your health care provider or dietitian to determine how many calories you should get each day. To meet your daily calorie goal, you will need to: Find out how many calories are in each food that you would like to eat. Try to do this before you eat. Decide how much of the food you plan to eat. Keep a food log. Do this by writing down what you ate and how many calories it had. To successfully lose weight, it is important to balance calorie counting with a healthy lifestyle that includes regular activity. Where do I find calorie information?  The number of calories in a food can be found on a Nutrition Facts label. If a food does not have a Nutrition Facts label, try  to look up the calories online or ask your dietitian for help. Remember that calories are listed per serving. If you choose to have more than one serving of a food, you will have to multiply the calories per serving by the number of servings you plan to eat. For example, the label on a package of bread might say that a serving size is 1 slice and that there are 90 calories in a serving. If you eat 1 slice, you will have eaten 90 calories. If you eat 2 slices, you will have eaten 180 calories. How do I keep a food log? After each time that you eat, record the following in your food log as soon as possible: What you ate. Be sure to include toppings, sauces, and other extras on the food. How much you ate. This can be measured in cups, ounces, or number of items. How many calories were in each food and drink. The total number of calories in the food you ate. Keep your food log near you, such as in a pocket-sized notebook or on an app or website on your mobile phone. Some programs will calculate calories for you and show you how many calories you have left to meet your daily goal. What are some portion-control tips? Know how many calories are in a serving. This will help you know how many servings you can have of a certain   food. Use a measuring cup to measure serving sizes. You could also try weighing out portions on a kitchen scale. With time, you will be able to estimate serving sizes for some foods. Take time to put servings of different foods on your favorite plates or in your favorite bowls and cups so you know what a serving looks like. Try not to eat straight from a food's packaging, such as from a bag or box. Eating straight from the package makes it hard to see how much you are eating and can lead to overeating. Put the amount you would like to eat in a cup or on a plate to make sure you are eating the right portion. Use smaller plates, glasses, and bowls for smaller portions and to prevent  overeating. Try not to multitask. For example, avoid watching TV or using your computer while eating. If it is time to eat, sit down at a table and enjoy your food. This will help you recognize when you are full. It will also help you be more mindful of what and how much you are eating. What are tips for following this plan? Reading food labels Check the calorie count compared with the serving size. The serving size may be smaller than what you are used to eating. Check the source of the calories. Try to choose foods that are high in protein, fiber, and vitamins, and low in saturated fat, trans fat, and sodium. Shopping Read nutrition labels while you shop. This will help you make healthy decisions about which foods to buy. Pay attention to nutrition labels for low-fat or fat-free foods. These foods sometimes have the same number of calories or more calories than the full-fat versions. They also often have added sugar, starch, or salt to make up for flavor that was removed with the fat. Make a grocery list of lower-calorie foods and stick to it. Cooking Try to cook your favorite foods in a healthier way. For example, try baking instead of frying. Use low-fat dairy products. Meal planning Use more fruits and vegetables. One-half of your plate should be fruits and vegetables. Include lean proteins, such as chicken, turkey, and fish. Lifestyle Each week, aim to do one of the following: 150 minutes of moderate exercise, such as walking. 75 minutes of vigorous exercise, such as running. General information Know how many calories are in the foods you eat most often. This will help you calculate calorie counts faster. Find a way of tracking calories that works for you. Get creative. Try different apps or programs if writing down calories does not work for you. What foods should I eat?  Eat nutritious foods. It is better to have a nutritious, high-calorie food, such as an avocado, than a food with  few nutrients, such as a bag of potato chips. Use your calories on foods and drinks that will fill you up and will not leave you hungry soon after eating. Examples of foods that fill you up are nuts and nut butters, vegetables, lean proteins, and high-fiber foods such as whole grains. High-fiber foods are foods with more than 5 g of fiber per serving. Pay attention to calories in drinks. Low-calorie drinks include water and unsweetened drinks. The items listed above may not be a complete list of foods and beverages you can eat. Contact a dietitian for more information. What foods should I limit? Limit foods or drinks that are not good sources of vitamins, minerals, or protein or that are high in unhealthy fats. These   include: Candy. Other sweets. Sodas, specialty coffee drinks, alcohol, and juice. The items listed above may not be a complete list of foods and beverages you should avoid. Contact a dietitian for more information. How do I count calories when eating out? Pay attention to portions. Often, portions are much larger when eating out. Try these tips to keep portions smaller: Consider sharing a meal instead of getting your own. If you get your own meal, eat only half of it. Before you start eating, ask for a container and put half of your meal into it. When available, consider ordering smaller portions from the menu instead of full portions. Pay attention to your food and drink choices. Knowing the way food is cooked and what is included with the meal can help you eat fewer calories. If calories are listed on the menu, choose the lower-calorie options. Choose dishes that include vegetables, fruits, whole grains, low-fat dairy products, and lean proteins. Choose items that are boiled, broiled, grilled, or steamed. Avoid items that are buttered, battered, fried, or served with cream sauce. Items labeled as crispy are usually fried, unless stated otherwise. Choose water, low-fat milk,  unsweetened iced tea, or other drinks without added sugar. If you want an alcoholic beverage, choose a lower-calorie option, such as a glass of wine or light beer. Ask for dressings, sauces, and syrups on the side. These are usually high in calories, so you should limit the amount you eat. If you want a salad, choose a garden salad and ask for grilled meats. Avoid extra toppings such as bacon, cheese, or fried items. Ask for the dressing on the side, or ask for olive oil and vinegar or lemon to use as dressing. Estimate how many servings of a food you are given. Knowing serving sizes will help you be aware of how much food you are eating at restaurants. Where to find more information Centers for Disease Control and Prevention: www.cdc.gov [place] Department of Agriculture: myplate.gov Summary Calorie counting means keeping track of how many calories you eat and drink each day. If you eat fewer calories than your body needs, you should lose weight. A healthy amount of weight to lose per week is usually 1-2 lb (0.5-0.9 kg). This usually means reducing your daily calorie intake by 500-750 calories. The number of calories in a food can be found on a Nutrition Facts label. If a food does not have a Nutrition Facts label, try to look up the calories online or ask your dietitian for help. Use smaller plates, glasses, and bowls for smaller portions and to prevent overeating. Use your calories on foods and drinks that will fill you up and not leave you hungry shortly after a meal. This information is not intended to replace advice given to you by your health care provider. Make sure you discuss any questions you have with your health care provider. Document Revised: 04/25/2019 Document Reviewed: 04/25/2019 Elsevier Patient Education  2023 [employer] Inc[employer]  

## 2022-12-05 NOTE — Progress Notes (Signed)
`+ +`  Established Patient Office Visit  Subjective   Patient ID: Veronica Stanton, female    DOB: 1980-08-16  Age: 42 y.o. MRN: [id number]  Chief Complaint  Patient presents with   Weight Check    HPI Discussed the use of AI scribe software for clinical note transcription with the patient, who gave verbal consent to proceed.  History of Present Illness   The patient, on Zepbound for weight loss, reports excellent response to the medication. She has recently increased the dose from 2.5 to 5mg  and is tolerating it well. She reports occasional abdominal tenderness, which she attributes to ovulation, and a transient pain in the lower abdomen, which has since resolved. She has not experienced any significant side effects from the medication.  The patient also reports a significant change in her dietary habits, including eliminating bread and sugar and limiting pasta intake. She has not noticed any significant weight loss that could potentially affect her menstrual cycle.  The patient also mentions using the medication via injection, rotating the injection sites between the abdomen and thighs. She has noticed a slight tenderness at the injection sites but nothing concerning.      Patient Active Problem List   Diagnosis Date Noted   Dysuria 09/24/2021   High risk heterosexual behavior 04/02/2021   Morbid obesity (HCC) 04/02/2021   Hyperpigmentation 04/02/2021   Eczema 03/03/2021   COVID-19 11/24/2020   Paroxysmal tachycardia (HCC) 06/15/2020   Lower extremity edema 06/15/2020   Obesity (BMI 30.0-34.9) 10/21/2019   History of seizures 01/03/2019   Altered level of consciousness 02/16/2018   Left arm weakness 06/08/2016   Localization-related idiopathic epilepsy and epileptic syndromes with seizures of localized onset, not intractable, without status epilepticus (HCC) 09/02/2014   Awareness alteration, transient 07/30/2014   Faintness 07/30/2014   Syncope 07/28/2014   Rapid  palpitations 11/16/2011   Dyspnea 11/16/2011   Past Medical History:  Diagnosis Date   No diagnosis    Palpitations    Past Surgical History:  Procedure Laterality Date   negative     Social History   Tobacco Use   Smoking status: Never   Smokeless tobacco: Never  Vaping Use   Vaping status: Never Used  Substance Use Topics   Alcohol use: No   Drug use: No   Social History   Socioeconomic History   Marital status: Single    Spouse name: Not on file   Number of children: Not on file   Years of education: Not on file   Highest education level: Not on file  Occupational History   Not on file  Tobacco Use   Smoking status: Never   Smokeless tobacco: Never  Vaping Use   Vaping status: Never Used  Substance and Sexual Activity   Alcohol use: No   Drug use: No   Sexual activity: Not on file  Other Topics Concern   Not on file  Social History Narrative   Not on file   Social Determinants of Health   Financial Resource Strain: Not on file  Food Insecurity: Not on file  Transportation Needs: Not on file  Physical Activity: Not on file  Stress: Not on file  Social Connections: Unknown (11/24/2022)   Received from [facility]   Social Network    Social Network: Not on file  Intimate Partner Violence: Unknown (11/24/2022)   Received from [facility]   HITS    Physically Hurt: Not on file    Insult or Talk Down  To: Not on file    Threaten Physical Harm: Not on file    Scream or Curse: Not on file   Family Status  Relation Name Status   Mother  Alive   Father  Deceased at age 19   Brother  Alive   MGM  Deceased   MGF  Deceased   PGM  Deceased   PGF  Deceased   Pat Aunt  Deceased   Pat Aunt  Alive   Neg Hx  (Not Specified)  No partnership data on file   Family History  Problem Relation Age of Onset   Congestive Heart Failure Father    Kidney failure Father    Diabetes Mellitus II Paternal Aunt    AAA (abdominal aortic aneurysm) Paternal Aunt     Stroke Neg Hx    Heart disease Neg Hx    Mental illness Neg Hx    No Known Allergies    Review of Systems  Constitutional:  Negative for chills, fever and malaise/fatigue.  HENT:  Negative for congestion and hearing loss.   Eyes:  Negative for blurred vision and discharge.  Respiratory:  Negative for cough, sputum production and shortness of breath.   Cardiovascular:  Negative for chest pain, palpitations and leg swelling.  Gastrointestinal:  Negative for abdominal pain, blood in stool, constipation, diarrhea, heartburn, nausea and vomiting.  Genitourinary:  Negative for dysuria, frequency, hematuria and urgency.  Musculoskeletal:  Negative for back pain, falls and myalgias.  Skin:  Negative for rash.  Neurological:  Negative for dizziness, sensory change, loss of consciousness, weakness and headaches.  Endo/Heme/Allergies:  Negative for environmental allergies. Does not bruise/bleed easily.  Psychiatric/Behavioral:  Negative for depression and suicidal ideas. The patient is not nervous/anxious and does not have insomnia.       Objective:     BP 124/80 (BP Location: Left Arm, Patient Position: Sitting, Cuff Size: Normal)   Pulse 88   Temp 98.8 F (37.1 C) (Oral)   Resp 18   Ht 5\' 5"  (1.651 m)   Wt 191 lb 12.8 oz (87 kg)   LMP 11/18/2022 (Exact Date)   SpO2 100%   BMI 31.92 kg/m  BP Readings from Last 3 Encounters:  12/05/22 124/80  11/24/22 112/68  09/13/22 120/80   Wt Readings from Last 3 Encounters:  12/05/22 191 lb 12.8 oz (87 kg)  11/24/22 195 lb 6.4 oz (88.6 kg)  09/13/22 211 lb 12.8 oz (96.1 kg)   SpO2 Readings from Last 3 Encounters:  12/05/22 100%  11/24/22 97%  09/13/22 97%      Physical Exam Vitals and nursing note reviewed.  Constitutional:      General: She is not in acute distress.    Appearance: Normal appearance. She is well-developed.  HENT:     Head: Normocephalic and atraumatic.  Eyes:     General: No scleral icterus.       Right eye:  No discharge.        Left eye: No discharge.  Cardiovascular:     Rate and Rhythm: Normal rate and regular rhythm.     Heart sounds: No murmur heard. Pulmonary:     Effort: Pulmonary effort is normal. No respiratory distress.     Breath sounds: Normal breath sounds.  Musculoskeletal:        General: Normal range of motion.     Cervical back: Normal range of motion and neck supple.     Right lower leg: No edema.  Left lower leg: No edema.  Skin:    General: Skin is warm and dry.  Neurological:     Mental Status: She is alert and oriented to person, place, and time.  Psychiatric:        Mood and Affect: Mood normal.        Behavior: Behavior normal.        Thought Content: Thought content normal.        Judgment: Judgment normal.      No results found for any visits on 12/05/22.  Last CBC Lab Results  Component Value Date   WBC 8.2 09/13/2022   HGB 12.6 09/13/2022   HCT 38.2 09/13/2022   MCV 92.7 09/13/2022   MCH 29.6 04/02/2021   RDW 14.6 09/13/2022   PLT 160.0 Repeated and verified X2. 09/13/2022   Last metabolic panel Lab Results  Component Value Date   GLUCOSE 86 09/13/2022   NA 136 09/13/2022   K 4.2 09/13/2022   CL 103 09/13/2022   CO2 26 09/13/2022   BUN 10 09/13/2022   CREATININE 0.75 09/13/2022   GFR 97.07 09/13/2022   CALCIUM 8.8 09/13/2022   PROT 7.2 09/13/2022   ALBUMIN 3.8 09/13/2022   BILITOT 0.3 09/13/2022   ALKPHOS 37 (L) 09/13/2022   AST 13 09/13/2022   ALT 13 09/13/2022   ANIONGAP 5 07/19/2014   Last lipids Lab Results  Component Value Date   CHOL 176 09/13/2022   HDL 43.90 09/13/2022   LDLCALC 118 (H) 09/13/2022   TRIG 69.0 09/13/2022   CHOLHDL 4 09/13/2022   Last hemoglobin A1c Lab Results  Component Value Date   HGBA1C 5.4 09/13/2022   Last thyroid functions Lab Results  Component Value Date   TSH 1.08 04/02/2021   Last vitamin D Lab Results  Component Value Date   VD25OH 14.97 (L) 09/13/2022   Last vitamin B12  and Folate Lab Results  Component Value Date   VITAMINB12 323 04/02/2021      The 10-year ASCVD risk score (Arnett DK, et al., 2019) is: 1.1%    Assessment & Plan:   Problem List Items Addressed This Visit   None Visit Diagnoses     Obesity (BMI 35.0-39.9 without comorbidity)    -  Primary   Relevant Medications   tirzepatide (ZEPBOUND) 7.5 MG/0.5ML Pen   Other Relevant Orders   CBC with Differential/Platelet   Comprehensive metabolic panel   Lipid panel   TSH   Vitamin B12   VITAMIN D 25 Hydroxy (Vit-D Deficiency, Fractures)   Insulin, random     DOING WELL WITH MEDS --- DOWN 3 LBS IN 3 WEEKS   No follow-ups on file.    Donato Schultz, DO

## 2022-12-06 LAB — CBC WITH DIFFERENTIAL/PLATELET
Basophils Absolute: 0.1 10*3/uL (ref 0.0–0.1)
Basophils Relative: 0.8 % (ref 0.0–3.0)
Eosinophils Absolute: 0.1 10*3/uL (ref 0.0–0.7)
Eosinophils Relative: 1.2 % (ref 0.0–5.0)
HCT: 38.3 % (ref 36.0–46.0)
Hemoglobin: 12.6 g/dL (ref 12.0–15.0)
Lymphocytes Relative: 23.6 % (ref 12.0–46.0)
Lymphs Abs: 1.7 10*3/uL (ref 0.7–4.0)
MCHC: 32.8 g/dL (ref 30.0–36.0)
MCV: 92.9 fl (ref 78.0–100.0)
Monocytes Absolute: 0.7 10*3/uL (ref 0.1–1.0)
Monocytes Relative: 9.4 % (ref 3.0–12.0)
Neutro Abs: 4.7 10*3/uL (ref 1.4–7.7)
Neutrophils Relative %: 65 % (ref 43.0–77.0)
Platelets: 155 10*3/uL (ref 150.0–400.0)
RBC: 4.12 Mil/uL (ref 3.87–5.11)
RDW: 13.8 % (ref 11.5–15.5)
WBC: 7.3 10*3/uL (ref 4.0–10.5)

## 2022-12-06 LAB — COMPREHENSIVE METABOLIC PANEL
ALT: 8 U/L (ref 0–35)
AST: 10 U/L (ref 0–37)
Albumin: 3.6 g/dL (ref 3.5–5.2)
Alkaline Phosphatase: 36 U/L — ABNORMAL LOW (ref 39–117)
BUN: 7 mg/dL (ref 6–23)
CO2: 27 meq/L (ref 19–32)
Calcium: 9.3 mg/dL (ref 8.4–10.5)
Chloride: 103 meq/L (ref 96–112)
Creatinine, Ser: 0.74 mg/dL (ref 0.40–1.20)
GFR: 98.49 mL/min (ref >60.00)
Glucose, Bld: 72 mg/dL (ref 70–99)
Potassium: 4.3 meq/L (ref 3.5–5.1)
Sodium: 137 meq/L (ref 135–145)
Total Bilirubin: 0.4 mg/dL (ref 0.2–1.2)
Total Protein: 6.9 g/dL (ref 6.0–8.3)

## 2022-12-06 LAB — LIPID PANEL
Cholesterol: 180 mg/dL (ref 0–200)
HDL: 44.3 mg/dL (ref >39.00)
LDL Cholesterol: 126 mg/dL — ABNORMAL HIGH (ref 0–99)
NonHDL: 136
Total CHOL/HDL Ratio: 4
Triglycerides: 51 mg/dL (ref 0.0–149.0)
VLDL: 10.2 mg/dL (ref 0.0–40.0)

## 2022-12-06 LAB — INSULIN, RANDOM: Insulin: 8.9 u[IU]/mL

## 2022-12-06 LAB — VITAMIN D 25 HYDROXY (VIT D DEFICIENCY, FRACTURES): VITD: 31.48 ng/mL (ref 30.00–100.00)

## 2022-12-06 LAB — VITAMIN B12: Vitamin B-12: 167 pg/mL — ABNORMAL LOW (ref 211–911)

## 2022-12-06 LAB — TSH: TSH: 0.92 u[IU]/mL (ref 0.35–5.50)

## 2022-12-22 ENCOUNTER — Other Ambulatory Visit: Payer: Self-pay | Admitting: Family Medicine

## 2023-01-17 ENCOUNTER — Ambulatory Visit (INDEPENDENT_AMBULATORY_CARE_PROVIDER_SITE_OTHER): Payer: BC Managed Care – PPO | Admitting: Family Medicine

## 2023-01-17 ENCOUNTER — Other Ambulatory Visit (HOSPITAL_COMMUNITY): Payer: Self-pay

## 2023-01-17 ENCOUNTER — Encounter: Payer: Self-pay | Admitting: Family Medicine

## 2023-01-17 VITALS — BP 112/80 | HR 84 | Temp 98.3°F | Resp 18 | Ht 65.0 in | Wt 184.4 lb

## 2023-01-17 DIAGNOSIS — E66811 Obesity, class 1: Secondary | ICD-10-CM | POA: Diagnosis not present

## 2023-01-17 DIAGNOSIS — K5901 Slow transit constipation: Secondary | ICD-10-CM | POA: Insufficient documentation

## 2023-01-17 DIAGNOSIS — Z6830 Body mass index (BMI) 30.0-30.9, adult: Secondary | ICD-10-CM | POA: Diagnosis not present

## 2023-01-17 NOTE — Progress Notes (Signed)
Established Patient Office Visit  Subjective   Patient ID: Veronica Stanton, female    DOB: [birth year]  Age: 42 y.o. MRN: [id number]  Chief Complaint  Patient presents with   Constipation    Pt states not having frequent stools. Pt states taking laxatives. Sxs prior to go on to Zepbound.     HPI Discussed the use of AI scribe software for clinical note transcription with the patient, who gave verbal consent to proceed.  History of Present Illness   The patient, on Zepbound for an unspecified condition, presents with constipation. She reports a lack of urge to defecate, and has been self-managing with Dulcolax laxative once a week. She notes that the constipation was present prior to starting Zepbound, and has not worsened since starting the medication. She has tried Senokot, a laxative, without success. She recently tried Dulcolax chewable tablets and prune juice, which resulted in a bowel movement a day and a half later. She reports that her stools are hard, sometimes appearing as round balls. She denies any abdominal pain. She also reports adequate hydration, consuming three 20-ounce bottles of water daily. She has a history of protein deficiency and has been advised to increase her protein intake.      Patient Active Problem List   Diagnosis Date Noted   Slow transit constipation 01/17/2023   Dysuria 09/24/2021   High risk heterosexual behavior 04/02/2021   Morbid obesity (HCC) 04/02/2021   Hyperpigmentation 04/02/2021   Eczema 03/03/2021   COVID-19 11/24/2020   Paroxysmal tachycardia (HCC) 06/15/2020   Lower extremity edema 06/15/2020   Obesity (BMI 30.0-34.9) 10/21/2019   History of seizures 01/03/2019   Altered level of consciousness 02/16/2018   Left arm weakness 06/08/2016   Localization-related idiopathic epilepsy and epileptic syndromes with seizures of localized onset, not intractable, without status epilepticus (HCC) 09/02/2014   Awareness alteration, transient  07/30/2014   Faintness 07/30/2014   Syncope 07/28/2014   Rapid palpitations 11/16/2011   Dyspnea 11/16/2011   Past Medical History:  Diagnosis Date   No diagnosis    Palpitations    Past Surgical History:  Procedure Laterality Date   negative     Social History   Tobacco Use   Smoking status: Never   Smokeless tobacco: Never  Vaping Use   Vaping status: Never Used  Substance Use Topics   Alcohol use: No   Drug use: No   Social History   Socioeconomic History   Marital status: Single    Spouse name: Not on file   Number of children: Not on file   Years of education: Not on file   Highest education level: Not on file  Occupational History   Not on file  Tobacco Use   Smoking status: Never   Smokeless tobacco: Never  Vaping Use   Vaping status: Never Used  Substance and Sexual Activity   Alcohol use: No   Drug use: No   Sexual activity: Not on file  Other Topics Concern   Not on file  Social History Narrative   Not on file   Social Determinants of Health   Financial Resource Strain: Not on file  Food Insecurity: Not on file  Transportation Needs: Not on file  Physical Activity: Not on file  Stress: Not on file  Social Connections: Unknown (11/24/2022)   Received from [facility]   Social Network    Social Network: Not on file  Intimate Partner Violence: Unknown (11/24/2022)   Received from [facility]  HITS    Physically Hurt: Not on file    Insult or Talk Down To: Not on file    Threaten Physical Harm: Not on file    Scream or Curse: Not on file   Family Status  Relation Name Status   Mother  Alive   Father  Deceased at age 97   Brother  Alive   MGM  Deceased   MGF  Deceased   PGM  Deceased   PGF  Deceased   Pat Aunt  Deceased   Pat Aunt  Alive   Neg Hx  (Not Specified)  No partnership data on file   Family History  Problem Relation Age of Onset   Congestive Heart Failure Father    Kidney failure Father    Diabetes Mellitus II  Paternal Aunt    AAA (abdominal aortic aneurysm) Paternal Aunt    Stroke Neg Hx    Heart disease Neg Hx    Mental illness Neg Hx    No Known Allergies    Review of Systems  Constitutional:  Negative for chills, fever and malaise/fatigue.  HENT:  Negative for congestion and hearing loss.   Eyes:  Negative for blurred vision and discharge.  Respiratory:  Negative for cough, sputum production and shortness of breath.   Cardiovascular:  Negative for chest pain, palpitations and leg swelling.  Gastrointestinal:  Positive for constipation. Negative for abdominal pain, blood in stool, diarrhea, heartburn, nausea and vomiting.  Genitourinary:  Negative for dysuria, frequency, hematuria and urgency.  Musculoskeletal:  Negative for back pain, falls and myalgias.  Skin:  Negative for rash.  Neurological:  Negative for dizziness, sensory change, loss of consciousness, weakness and headaches.  Endo/Heme/Allergies:  Negative for environmental allergies. Does not bruise/bleed easily.  Psychiatric/Behavioral:  Negative for depression and suicidal ideas. The patient is not nervous/anxious and does not have insomnia.       Objective:     BP 112/80 (BP Location: Right Arm, Patient Position: Sitting, Cuff Size: Normal)   Pulse 84   Temp 98.3 F (36.8 C) (Oral)   Resp 18   Ht 5\' 5"  (1.651 m)   Wt 184 lb 6.4 oz (83.6 kg)   SpO2 96%   BMI 30.69 kg/m  BP Readings from Last 3 Encounters:  01/17/23 112/80  12/05/22 124/80  11/24/22 112/68   Wt Readings from Last 3 Encounters:  01/17/23 184 lb 6.4 oz (83.6 kg)  12/05/22 191 lb 12.8 oz (87 kg)  11/24/22 195 lb 6.4 oz (88.6 kg)   SpO2 Readings from Last 3 Encounters:  01/17/23 96%  12/05/22 100%  11/24/22 97%      Physical Exam Vitals and nursing note reviewed.  Constitutional:      General: She is not in acute distress.    Appearance: Normal appearance. She is well-developed.  HENT:     Head: Normocephalic and atraumatic.  Eyes:      General: No scleral icterus.       Right eye: No discharge.        Left eye: No discharge.  Pulmonary:     Effort: Pulmonary effort is normal. No respiratory distress.  Musculoskeletal:        General: Normal range of motion.     Cervical back: Normal range of motion and neck supple.     Right lower leg: No edema.     Left lower leg: No edema.  Skin:    General: Skin is warm and dry.  Neurological:  Mental Status: She is alert and oriented to person, place, and time.  Psychiatric:        Mood and Affect: Mood normal.        Behavior: Behavior normal.        Thought Content: Thought content normal.        Judgment: Judgment normal.      No results found for any visits on 01/17/23.  Last CBC Lab Results  Component Value Date   WBC 7.3 12/05/2022   HGB 12.6 12/05/2022   HCT 38.3 12/05/2022   MCV 92.9 12/05/2022   MCH 29.6 04/02/2021   RDW 13.8 12/05/2022   PLT 155.0 12/05/2022   Last metabolic panel Lab Results  Component Value Date   GLUCOSE 72 12/05/2022   NA 137 12/05/2022   K 4.3 12/05/2022   CL 103 12/05/2022   CO2 27 12/05/2022   BUN 7 12/05/2022   CREATININE 0.74 12/05/2022   GFR 98.49 12/05/2022   CALCIUM 9.3 12/05/2022   PROT 6.9 12/05/2022   ALBUMIN 3.6 12/05/2022   BILITOT 0.4 12/05/2022   ALKPHOS 36 (L) 12/05/2022   AST 10 12/05/2022   ALT 8 12/05/2022   ANIONGAP 5 07/19/2014   Last lipids Lab Results  Component Value Date   CHOL 180 12/05/2022   HDL 44.30 12/05/2022   LDLCALC 126 (H) 12/05/2022   TRIG 51.0 12/05/2022   CHOLHDL 4 12/05/2022   Last hemoglobin A1c Lab Results  Component Value Date   HGBA1C 5.4 09/13/2022   Last thyroid functions Lab Results  Component Value Date   TSH 0.92 12/05/2022   Last vitamin D Lab Results  Component Value Date   VD25OH 31.48 12/05/2022   Last vitamin B12 and Folate Lab Results  Component Value Date   VITAMINB12 167 (L) 12/05/2022      The 10-year ASCVD risk score (Arnett DK, et  al., 2019) is: 0.7%    Assessment & Plan:   Problem List Items Addressed This Visit       Unprioritized   Slow transit constipation - Primary    Pt states this started before zepbound Try miralax and colace  Return to office as needed  Con't prune juice / prunes       Obesity (BMI 30.0-34.9)    Cont' zepound  F/u 3 months      Assessment and Plan    Constipation Chronic issue, exacerbated by Zepbound. No abdominal pain or signs of obstruction. Previously used Dulcolax and Senokot with limited success. Recently tried prune juice with some improvement. -Try over-the-counter MiraLAX as a gentle laxative, per directions on the bottle. -Add Colace as a stool softener. -Continue hydration and consider daily prune juice. -Report any abdominal pain or continued difficulty with bowel movements.  Weight Management On Zepbound, pleased with results. Recent weight loss noted. -Continue Zepbound as prescribed. -Ensure adequate protein intake at each meal to support muscle maintenance and fat loss. -Schedule follow-up towards the end of the month for Zepbound refill.  General Health Maintenance -Declined flu shot.        Return in about 3 months (around 04/19/2023) for weight .    Donato Schultz, DO

## 2023-01-17 NOTE — Assessment & Plan Note (Signed)
Pt states this started before zepbound Try miralax and colace  Return to office as needed  Con't prune juice / prunes

## 2023-01-17 NOTE — Assessment & Plan Note (Signed)
Cont' zepound  F/u 3 months

## 2023-03-06 ENCOUNTER — Ambulatory Visit: Payer: BC Managed Care – PPO | Admitting: Family Medicine

## 2023-03-06 ENCOUNTER — Telehealth: Payer: Self-pay | Admitting: Family Medicine

## 2023-03-06 NOTE — Telephone Encounter (Signed)
Pt requesting refill of Zepbound. Are we increasing to the next dose?

## 2023-03-06 NOTE — Telephone Encounter (Signed)
Pt came in asking for a refilll of zepbound and triamcinolone cream to [facility] [place] [facility] [address]

## 2023-03-07 MED ORDER — ZEPBOUND 10 MG/0.5ML ~~LOC~~ SOAJ: 10.0000 mg | SUBCUTANEOUS | 0 refills | Status: DC

## 2023-03-07 MED ORDER — TRIAMCINOLONE ACETONIDE 0.1 % EX CREA: 1.0000 | TOPICAL_CREAM | Freq: Two times a day (BID) | CUTANEOUS | 2 refills | Status: DC

## 2023-03-07 NOTE — Addendum Note (Signed)
Addended by: Roxanne Gates on: 03/07/2023 09:28 AM   Modules accepted: Orders

## 2023-03-07 NOTE — Telephone Encounter (Signed)
Refills have been sent.  

## 2023-03-09 ENCOUNTER — Ambulatory Visit: Payer: BC Managed Care – PPO | Admitting: Family Medicine

## 2023-03-17 ENCOUNTER — Telehealth: Payer: Self-pay

## 2023-03-17 NOTE — Telephone Encounter (Signed)
Copied from CRM [id number]. Topic: Clinical - Prescription Issue >> Mar 17, 2023  3:11 PM Veronica Stanton O wrote: Reason for CRM: patient is calling back concerning the prescription that was sent back because needed prior authorization Zepbound is the name of prescription. Patient is wanting to make sure that the doctor team get this taken care of . Patient is needing prescription

## 2023-03-17 NOTE — Telephone Encounter (Signed)
PA initiated via Covermymeds; KEY: [id number]. Awaiting determination.

## 2023-03-20 NOTE — Telephone Encounter (Signed)
PA approved.   Approved. Authorization Expiration Date: 03/16/2024

## 2023-04-03 ENCOUNTER — Ambulatory Visit: Payer: BC Managed Care – PPO | Admitting: Family Medicine

## 2023-05-01 ENCOUNTER — Telehealth: Payer: Self-pay | Admitting: Family Medicine

## 2023-05-01 NOTE — Telephone Encounter (Signed)
Patient is calling regarding her zepbound medication she is needing a 705 mg called in there was only a 10.5 ready for her to pick up today

## 2023-05-02 ENCOUNTER — Other Ambulatory Visit: Payer: Self-pay | Admitting: Family Medicine

## 2023-05-02 DIAGNOSIS — E669 Obesity, unspecified: Secondary | ICD-10-CM

## 2023-05-03 ENCOUNTER — Telehealth: Payer: Self-pay

## 2023-05-03 NOTE — Telephone Encounter (Signed)
 Copied from CRM [id number]. Topic: Clinical - Prescription Issue >> May 03, 2023 12:43 PM Chantha C wrote: Reason for CRM[facility]  (ZEPBOUND ) 7.5 MG/0.5ML Pen. Patient wants to know if it's ok to go back on tirzepatide  (ZEPBOUND ) 10 MG/0.5ML Pen since being off since 01/2023. Patient was on tirzepatide  (ZEPBOUND ) 7.5 MG/0.5ML Pen and would like to stay on 7.5mg  and not go to 10mg . Please call back to discuss [phone]  [id number] [place] [place] - [address] [place] [address] Phone:[phone]

## 2023-05-03 NOTE — Telephone Encounter (Signed)
 See other phone note

## 2023-05-03 NOTE — Telephone Encounter (Signed)
 Pt would like to stay on Zepound 7.5. okay to refill?

## 2023-05-08 ENCOUNTER — Encounter: Payer: Self-pay | Admitting: Family Medicine

## 2023-05-08 ENCOUNTER — Ambulatory Visit (INDEPENDENT_AMBULATORY_CARE_PROVIDER_SITE_OTHER): Payer: BC Managed Care – PPO | Admitting: Family Medicine

## 2023-05-08 VITALS — BP 120/90 | HR 85 | Temp 98.6°F | Resp 18 | Ht 65.0 in | Wt 198.8 lb

## 2023-05-08 DIAGNOSIS — E669 Obesity, unspecified: Secondary | ICD-10-CM | POA: Diagnosis not present

## 2023-05-08 DIAGNOSIS — E88819 Insulin resistance, unspecified: Secondary | ICD-10-CM

## 2023-05-08 MED ORDER — TIRZEPATIDE-WEIGHT MANAGEMENT 5 MG/0.5ML ~~LOC~~ SOLN
5.0000 mg | SUBCUTANEOUS | 0 refills | Status: DC
Start: 2023-05-08 — End: 2023-05-09

## 2023-05-08 NOTE — Progress Notes (Signed)
 Established Patient Office Visit  Subjective   Patient ID: Veronica Stanton, female    DOB: 12/31/1980  Age: 43 y.o. MRN: [id number]  Chief Complaint  Patient presents with   Weight Check    HPI Discussed the use of AI scribe software for clinical note transcription with the patient, who gave verbal consent to proceed.  History of Present Illness   Veronica Stanton is a 43 year old female who presents for medication management after a three-month hiatus from Zepbound   She previously responded well to Zepbound , with her last dosage being 7.5 mg. However, she has been off the medication due to a work assignment out of town. She is now ready to resume the medication and is seeking guidance on the appropriate dosage to restart. She mentions that the pharmacy has a 10 mg dosage available, but she is considering starting at 5 mg to avoid potential side effects such as nausea.  She engages in regular physical activity, primarily walking, although she finds it challenging to maintain this routine during cold weather. She has made dietary changes, significantly reducing her carbohydrate intake and ensuring adequate hydration by drinking lots of water. She does not consume sodas and is mindful of her protein intake, incorporating foods like chicken, fish, almonds, and Greek yogurt into her diet.  She experienced some skin issues previously, which have completely cleared up. Additionally, she notes feeling exhausted and is awaiting a B12 shot to address her fatigue.      Patient Active Problem List   Diagnosis Date Noted   Slow transit constipation 01/17/2023   Dysuria 09/24/2021   High risk heterosexual behavior 04/02/2021   Morbid obesity (HCC) 04/02/2021   Hyperpigmentation 04/02/2021   Eczema 03/03/2021   COVID-19 11/24/2020   Paroxysmal tachycardia (HCC) 06/15/2020   Lower extremity edema 06/15/2020   Obesity (BMI 30.0-34.9) 10/21/2019   History of seizures 01/03/2019   Altered level  of consciousness 02/16/2018   Left arm weakness 06/08/2016   Localization-related idiopathic epilepsy and epileptic syndromes with seizures of localized onset, not intractable, without status epilepticus (HCC) 09/02/2014   Awareness alteration, transient 07/30/2014   Faintness 07/30/2014   Syncope 07/28/2014   Rapid palpitations 11/16/2011   Dyspnea 11/16/2011   Past Medical History:  Diagnosis Date   No diagnosis    Palpitations    Past Surgical History:  Procedure Laterality Date   negative     Social History   Tobacco Use   Smoking status: Never   Smokeless tobacco: Never  Vaping Use   Vaping status: Never Used  Substance Use Topics   Alcohol use: No   Drug use: No   Social History   Socioeconomic History   Marital status: Single    Spouse name: Not on file   Number of children: Not on file   Years of education: Not on file   Highest education level: Not on file  Occupational History   Not on file  Tobacco Use   Smoking status: Never   Smokeless tobacco: Never  Vaping Use   Vaping status: Never Used  Substance and Sexual Activity   Alcohol use: No   Drug use: No   Sexual activity: Not on file  Other Topics Concern   Not on file  Social History Narrative   Not on file   Social Drivers of Health   Financial Resource Strain: Not on file  Food Insecurity: Not on file  Transportation Needs: Not on file  Physical Activity: Not  on file  Stress: Not on file  Social Connections: Unknown (11/24/2022)   Received from [facility]   Social Network    Social Network: Not on file  Intimate Partner Violence: Unknown (11/24/2022)   Received from [facility]   HITS    Physically Hurt: Not on file    Insult or Talk Down To: Not on file    Threaten Physical Harm: Not on file    Scream or Curse: Not on file   Family Status  Relation Name Status   Mother  Alive   Father  Deceased at age 43   Brother  Alive   MGM  Deceased   MGF  Deceased   PGM  Deceased    PGF  Deceased   Pat Aunt  Deceased   Pat Aunt  Alive   Neg Hx  (Not Specified)  No partnership data on file   Family History  Problem Relation Age of Onset   Congestive Heart Failure Father    Kidney failure Father    Diabetes Mellitus II Paternal Aunt    AAA (abdominal aortic aneurysm) Paternal Aunt    Stroke Neg Hx    Heart disease Neg Hx    Mental illness Neg Hx    No Known Allergies    Review of Systems  Constitutional:  Negative for fever and malaise/fatigue.  HENT:  Negative for congestion.   Eyes:  Negative for blurred vision.  Respiratory:  Negative for shortness of breath.   Cardiovascular:  Negative for chest pain, palpitations and leg swelling.  Gastrointestinal:  Negative for abdominal pain, blood in stool and nausea.  Genitourinary:  Negative for dysuria and frequency.  Musculoskeletal:  Negative for falls.  Skin:  Negative for rash.  Neurological:  Negative for dizziness, loss of consciousness and headaches.  Endo/Heme/Allergies:  Negative for environmental allergies.  Psychiatric/Behavioral:  Negative for depression. The patient is not nervous/anxious.       Objective:     BP (!) 120/90 (BP Location: Right Arm, Patient Position: Sitting, Cuff Size: Large)   Pulse 85   Temp 98.6 F (37 C) (Oral)   Resp 18   Ht 5\' 5"  (1.651 m)   Wt 198 lb 12.8 oz (90.2 kg)   SpO2 98%   BMI 33.08 kg/m  BP Readings from Last 3 Encounters:  05/08/23 (!) 120/90  01/17/23 112/80  12/05/22 124/80   Wt Readings from Last 3 Encounters:  05/08/23 198 lb 12.8 oz (90.2 kg)  01/17/23 184 lb 6.4 oz (83.6 kg)  12/05/22 191 lb 12.8 oz (87 kg)   SpO2 Readings from Last 3 Encounters:  05/08/23 98%  01/17/23 96%  12/05/22 100%      Physical Exam Vitals and nursing note reviewed.  Constitutional:      General: She is not in acute distress.    Appearance: Normal appearance. She is well-developed.  HENT:     Head: Normocephalic and atraumatic.  Eyes:     General: No  scleral icterus.       Right eye: No discharge.        Left eye: No discharge.  Cardiovascular:     Rate and Rhythm: Normal rate and regular rhythm.     Heart sounds: No murmur heard. Pulmonary:     Effort: Pulmonary effort is normal. No respiratory distress.     Breath sounds: Normal breath sounds.  Musculoskeletal:        General: Normal range of motion.     Cervical  back: Normal range of motion and neck supple.     Right lower leg: No edema.     Left lower leg: No edema.  Skin:    General: Skin is warm and dry.  Neurological:     Mental Status: She is alert and oriented to person, place, and time.  Psychiatric:        Mood and Affect: Mood normal.        Behavior: Behavior normal.        Thought Content: Thought content normal.        Judgment: Judgment normal.      No results found for any visits on 05/08/23.  Last CBC Lab Results  Component Value Date   WBC 7.3 12/05/2022   HGB 12.6 12/05/2022   HCT 38.3 12/05/2022   MCV 92.9 12/05/2022   MCH 29.6 04/02/2021   RDW 13.8 12/05/2022   PLT 155.0 12/05/2022   Last metabolic panel Lab Results  Component Value Date   GLUCOSE 72 12/05/2022   NA 137 12/05/2022   K 4.3 12/05/2022   CL 103 12/05/2022   CO2 27 12/05/2022   BUN 7 12/05/2022   CREATININE 0.74 12/05/2022   GFR 98.49 12/05/2022   CALCIUM 9.3 12/05/2022   PROT 6.9 12/05/2022   ALBUMIN 3.6 12/05/2022   BILITOT 0.4 12/05/2022   ALKPHOS 36 (L) 12/05/2022   AST 10 12/05/2022   ALT 8 12/05/2022   ANIONGAP 5 07/19/2014   Last lipids Lab Results  Component Value Date   CHOL 180 12/05/2022   HDL 44.30 12/05/2022   LDLCALC 126 (H) 12/05/2022   TRIG 51.0 12/05/2022   CHOLHDL 4 12/05/2022   Last hemoglobin A1c Lab Results  Component Value Date   HGBA1C 5.4 09/13/2022   Last thyroid  functions Lab Results  Component Value Date   TSH 0.92 12/05/2022   Last vitamin D  Lab Results  Component Value Date   VD25OH 31.48 12/05/2022   Last vitamin  B12 and Folate Lab Results  Component Value Date   VITAMINB12 167 (L) 12/05/2022      The 10-year ASCVD risk score (Arnett DK, et al., 2019) is: 1%    Assessment & Plan:   Problem List Items Addressed This Visit   None Visit Diagnoses       Obesity (BMI 35.0-39.9 without comorbidity)    -  Primary   Relevant Medications   tirzepatide  5 MG/0.5ML injection vial     Insulin  resistance       Relevant Medications   tirzepatide  5 MG/0.5ML injection vial     Assessment and Plan    Medication Management for Zepbound  Off Zepbound  for three months due to work assignment. Previously responded well to 7.5 mg but concerned about potential nausea upon restarting. Restart at 5 mg to minimize side effects, with plans to increase to 7.5 mg if well tolerated before the next visit. Follow up in three months for reassessment and potential dosage adjustment.  Diet and Exercise Management Carbohydrate intake significantly reduced and sodas avoided. Hydration is maintained, and daily step goals are met. Protein intake is encouraged despite potential appetite suppression from medication, with sources including chicken, fish, almonds, protein drinks, and [place] yogurt. Continue reduced carbohydrate diet, ensure adequate protein intake, and maintain hydration and daily step goals.  General Health Maintenance Engaging in regular physical activity and dietary adjustments to improve overall health. No immediate lab work required while off medication. Labs will be reassessed in three months.  Follow-up in three  months.        Return in about 3 months (around 08/05/2023).    Masin Shatto R Lowne Chase, DO

## 2023-05-09 ENCOUNTER — Other Ambulatory Visit: Payer: Self-pay | Admitting: Family Medicine

## 2023-05-09 ENCOUNTER — Ambulatory Visit: Payer: Self-pay | Admitting: Family Medicine

## 2023-05-09 DIAGNOSIS — E88819 Insulin resistance, unspecified: Secondary | ICD-10-CM

## 2023-05-09 DIAGNOSIS — E669 Obesity, unspecified: Secondary | ICD-10-CM

## 2023-05-09 MED ORDER — ZEPBOUND 5 MG/0.5ML ~~LOC~~ SOAJ: 5.0000 mg | SUBCUTANEOUS | 0 refills | Status: DC

## 2023-05-09 NOTE — Telephone Encounter (Signed)
Copied from CRM [id number]. Topic: Clinical - Prescription Issue >> May 03, 2023 12:43 PM Chantha C wrote: Reason for CRM[facility] ([facility]PBOUND) 7.5 MG/0.5ML Pen. Patient wants to know if it's ok to go back on tirzepatide (ZEPBOUND) 10 MG/0.5ML Pen since being off since 01/2023. Patient was on tirzepatide (ZEPBOUND) 7.5 MG/0.5ML Pen and would like to stay on 7.5mg  and not go to 10mg . Please call back to discuss [phone]  [id number] [place] [place] - [address] [place] [address] Phone:[phone]:[phone] >> May 09, 2023  4:24 PM Andrya H wrote: Patient called to advise that the wrong needles for Zepbound injection were sent to the pharmacy yesterday. Insurance denied the vials. Please follow up with both patient and pharmacy to receive clarification about the appropriate needles.  Please assist.

## 2023-05-09 NOTE — Addendum Note (Signed)
Addended byConrad Burley D on: 05/09/2023 04:48 PM   Modules accepted: Orders

## 2023-05-09 NOTE — Telephone Encounter (Signed)
Rx re-sent w/ pen instead of vials. Will have to wait and see if PA is needed.

## 2023-05-11 ENCOUNTER — Other Ambulatory Visit: Payer: Self-pay | Admitting: Family Medicine

## 2023-05-11 MED ORDER — ZEPBOUND 5 MG/0.5ML ~~LOC~~ SOAJ: 5.0000 mg | SUBCUTANEOUS | 0 refills | Status: DC

## 2023-05-11 NOTE — Telephone Encounter (Signed)
Rx was sent on 05/09/23 however Rx resent.

## 2023-05-11 NOTE — Telephone Encounter (Signed)
Copied from CRM [id number]. Topic: Clinical - Medication Refill >> May 11, 2023 11:10 AM Eunice Blase wrote: Most Recent Primary Care Visit:  Provider: Seabron Spates R  Department: [facility]  Visit Type: OFFICE VISIT  Date: 05/08/2023  Medication: tirzepatide (ZEPBOUND) 5 MG/0.5ML Pen  Has the patient contacted their pharmacy? Yes (Agent: If no, request that the patient contact the pharmacy for the refill. If patient does not wish to contact the pharmacy document the reason why and proceed with request.) (Agent: If yes, when and what did the pharmacy advise?)Pharmacy have not received approval.   Is this the correct pharmacy for this prescription? Yes If no, delete pharmacy and type the correct one.  This is the patient's preferred pharmacy:  [id number] [place], [place] - [address] [place] [address] Phone: [phone] Fax: [phone]   Has the prescription been filled recently? Yes  Is the patient out of the medication? Yes  Has the patient been seen for an appointment in the last year OR does the patient have an upcoming appointment? Yes  Can we respond through MyChart? Yes  Agent: Please be advised that Rx refills may take up to 3 business days. We ask that you follow-up with your pharmacy.

## 2023-07-17 ENCOUNTER — Other Ambulatory Visit: Payer: Self-pay | Admitting: Family Medicine

## 2023-07-18 ENCOUNTER — Telehealth: Payer: Self-pay | Admitting: Emergency Medicine

## 2023-07-18 NOTE — Telephone Encounter (Signed)
 Copied from CRM [id number]. Topic: Clinical - Medication Question >> Jul 18, 2023  3:54 PM Georgeann Kindred wrote: Reason for CRM: Patient called and stated she received a message from her pharmacy stating that her prescription for tirzepatide  (ZEPBOUND ) 5 MG/0.5ML Pen had been denied by the prescriber. Please contact patient at [phone].

## 2023-07-19 ENCOUNTER — Telehealth: Payer: Self-pay

## 2023-07-19 MED ORDER — ZEPBOUND 7.5 MG/0.5ML ~~LOC~~ SOAJ: 7.5000 mg | SUBCUTANEOUS | 0 refills | Status: DC

## 2023-07-19 NOTE — Telephone Encounter (Signed)
 See other telephone message

## 2023-07-19 NOTE — Telephone Encounter (Signed)
 Copied from CRM [id number]. Topic: General - Other >> Jul 19, 2023 11:26 AM Jenice Mitts wrote: Reason for UJW:[id number] is calling for the zepbound  increase to 7.5mg 

## 2023-07-19 NOTE — Addendum Note (Signed)
 Addended by: Ysidro Her on: 07/19/2023 01:07 PM   Modules accepted: Orders

## 2023-07-19 NOTE — Telephone Encounter (Signed)
 Rx sent. Message sent to prior auth to start PA

## 2023-07-20 ENCOUNTER — Telehealth: Payer: Self-pay

## 2023-07-20 ENCOUNTER — Other Ambulatory Visit (HOSPITAL_COMMUNITY): Payer: Self-pay

## 2023-07-20 NOTE — Telephone Encounter (Signed)
 Pharmacy Patient Advocate Encounter   Received notification from Pt Calls Messages that prior authorization for Zepbound  7.5mg /0.67ml is required/requested.   Insurance verification completed.   The patient is insured through  [employer]  .   Per test claim: Refill too soon. PA is not needed at this time. Medication was filled 07/19/2023. Next eligible fill date is 08/09/2023.

## 2023-07-20 NOTE — Telephone Encounter (Signed)
 Per test claim, medication was filled 07/19/2023 and will fill again 08/09/2023. No PA needed at this time, thank you.

## 2023-08-07 ENCOUNTER — Ambulatory Visit (INDEPENDENT_AMBULATORY_CARE_PROVIDER_SITE_OTHER): Payer: BC Managed Care – PPO | Admitting: Family Medicine

## 2023-08-07 ENCOUNTER — Encounter: Payer: Self-pay | Admitting: Family Medicine

## 2023-08-07 VITALS — BP 110/80 | HR 76 | Temp 98.4°F | Resp 16 | Ht 65.0 in | Wt 186.6 lb

## 2023-08-07 DIAGNOSIS — E66811 Obesity, class 1: Secondary | ICD-10-CM | POA: Diagnosis not present

## 2023-08-07 DIAGNOSIS — E669 Obesity, unspecified: Secondary | ICD-10-CM | POA: Diagnosis not present

## 2023-08-07 DIAGNOSIS — E88819 Insulin resistance, unspecified: Secondary | ICD-10-CM | POA: Diagnosis not present

## 2023-08-07 DIAGNOSIS — E559 Vitamin D deficiency, unspecified: Secondary | ICD-10-CM | POA: Diagnosis not present

## 2023-08-07 LAB — COMPREHENSIVE METABOLIC PANEL WITH GFR
ALT: 11 U/L (ref 0–35)
AST: 12 U/L (ref 0–37)
Albumin: 4 g/dL (ref 3.5–5.2)
Alkaline Phosphatase: 36 U/L — ABNORMAL LOW (ref 39–117)
BUN: 8 mg/dL (ref 6–23)
CO2: 26 meq/L (ref 19–32)
Calcium: 9.1 mg/dL (ref 8.4–10.5)
Chloride: 102 meq/L (ref 96–112)
Creatinine, Ser: 0.71 mg/dL (ref 0.40–1.20)
GFR: 103.02 mL/min (ref >60.00)
Glucose, Bld: 80 mg/dL (ref 70–99)
Potassium: 4.3 meq/L (ref 3.5–5.1)
Sodium: 136 meq/L (ref 135–145)
Total Bilirubin: 0.3 mg/dL (ref 0.2–1.2)
Total Protein: 7.3 g/dL (ref 6.0–8.3)

## 2023-08-07 LAB — LIPID PANEL
Cholesterol: 192 mg/dL (ref 0–200)
HDL: 49.6 mg/dL (ref >39.00)
LDL Cholesterol: 136 mg/dL — ABNORMAL HIGH (ref 0–99)
NonHDL: 142.76
Total CHOL/HDL Ratio: 4
Triglycerides: 35 mg/dL (ref 0.0–149.0)
VLDL: 7 mg/dL (ref 0.0–40.0)

## 2023-08-07 LAB — CBC WITH DIFFERENTIAL/PLATELET
Basophils Absolute: 0 10*3/uL (ref 0.0–0.1)
Basophils Relative: 0.5 % (ref 0.0–3.0)
Eosinophils Absolute: 0.1 10*3/uL (ref 0.0–0.7)
Eosinophils Relative: 1.1 % (ref 0.0–5.0)
HCT: 39.2 % (ref 36.0–46.0)
Hemoglobin: 13.1 g/dL (ref 12.0–15.0)
Lymphocytes Relative: 27.6 % (ref 12.0–46.0)
Lymphs Abs: 1.5 10*3/uL (ref 0.7–4.0)
MCHC: 33.3 g/dL (ref 30.0–36.0)
MCV: 93.1 fl (ref 78.0–100.0)
Monocytes Absolute: 0.5 10*3/uL (ref 0.1–1.0)
Monocytes Relative: 8.6 % (ref 3.0–12.0)
Neutro Abs: 3.3 10*3/uL (ref 1.4–7.7)
Neutrophils Relative %: 62.2 % (ref 43.0–77.0)
Platelets: 202 10*3/uL (ref 150.0–400.0)
RBC: 4.22 Mil/uL (ref 3.87–5.11)
RDW: 14.8 % (ref 11.5–15.5)
WBC: 5.3 10*3/uL (ref 4.0–10.5)

## 2023-08-07 LAB — HEMOGLOBIN A1C: Hgb A1c MFr Bld: 5.3 % (ref 4.6–6.5)

## 2023-08-07 LAB — TSH: TSH: 1.05 u[IU]/mL (ref 0.35–5.50)

## 2023-08-07 LAB — VITAMIN B12: Vitamin B-12: 195 pg/mL — ABNORMAL LOW (ref 211–911)

## 2023-08-07 LAB — VITAMIN D 25 HYDROXY (VIT D DEFICIENCY, FRACTURES): VITD: 23.05 ng/mL — ABNORMAL LOW (ref 30.00–100.00)

## 2023-08-07 MED ORDER — TIRZEPATIDE-WEIGHT MANAGEMENT 5 MG/0.5ML ~~LOC~~ SOLN: 5.0000 mg | SUBCUTANEOUS | 3 refills | Status: DC

## 2023-08-07 NOTE — Progress Notes (Signed)
 Established Patient Office Visit  Subjective   Patient ID: Veronica Stanton, female    DOB: Jun 02, 1980  Age: 43 y.o. MRN: [id number]  Chief Complaint  Patient presents with   Weight Check    Pt would like to discuss going back down 5mg     HPI Discussed the use of AI scribe software for clinical note transcription with the patient, who gave verbal consent to proceed.  History of Present Illness Veronica Stanton is a 43 year old female who presents with a rash after increasing her Zepbound  dosage.  She developed a rash on the inside of her elbows after increasing her Zepbound  dosage from 5 mg to 7.5 mg. The rash appeared as rough patches on both arms approximately two weeks ago and resolved after she skipped her last dose, which was due last Monday.  She has experienced significant weight loss, currently weighing 180 pounds, down from 200 pounds, indicating a loss of approximately 20 pounds. She is satisfied with her progress and attributes it to the use of Zepbound  and lifestyle changes.  She has a history of constipation, which she manages with Miralax and occasionally Dulcolax tablets. She notes that the medication may cause constipation, but she feels fine with her current management. No issues with constipation under her current regimen.  She engages in regular physical activity, walking at least three miles every other day, and ensures adequate protein intake with foods like boiled eggs and salmon. She recalls previously being low on protein, which she attributes to the medication's appetite-suppressing effects.  No current rash or skin issues.   Patient Active Problem List   Diagnosis Date Noted   Slow transit constipation 01/17/2023   Dysuria 09/24/2021   High risk heterosexual behavior 04/02/2021   Morbid obesity (HCC) 04/02/2021   Hyperpigmentation 04/02/2021   Eczema 03/03/2021   COVID-19 11/24/2020   Paroxysmal tachycardia (HCC) 06/15/2020    Lower extremity edema 06/15/2020   Obesity (BMI 30.0-34.9) 10/21/2019   History of seizures 01/03/2019   Altered level of consciousness 02/16/2018   Left arm weakness 06/08/2016   Localization-related idiopathic epilepsy and epileptic syndromes with seizures of localized onset, not intractable, without status epilepticus (HCC) 09/02/2014   Awareness alteration, transient 07/30/2014   Faintness 07/30/2014   Syncope 07/28/2014   Rapid palpitations 11/16/2011   Dyspnea 11/16/2011   Past Medical History:  Diagnosis Date   No diagnosis    Palpitations    Past Surgical History:  Procedure Laterality Date   negative     Social History   Tobacco Use   Smoking status: Never   Smokeless tobacco: Never  Vaping Use   Vaping status: Never Used  Substance Use Topics   Alcohol use: No   Drug use: No   Social History   Socioeconomic History   Marital status: Single    Spouse name: Not on file   Number of children: Not on file   Years of education: Not on file   Highest education level: Not on file  Occupational History   Not on file  Tobacco Use   Smoking status: Never   Smokeless tobacco: Never  Vaping Use   Vaping status: Never Used  Substance and Sexual Activity   Alcohol use: No   Drug use: No   Sexual activity: Not on file  Other Topics Concern   Not on file  Social History Narrative   Not on  file   Social Drivers of Health   Financial Resource Strain: Not on file  Food Insecurity: Not on file  Transportation Needs: Not on file  Physical Activity: Not on file  Stress: Not on file  Social Connections: Unknown (11/24/2022)   [facility]   Social Network    Social Network: Not on file  Intimate Partner Violence: Unknown (11/24/2022)   Received from [facility]   HITS    Physically Hurt: Not on file    Insult or Talk Down To: Not on file    Threaten Physical Harm: Not on file    Scream or Curse: Not on file   Family Status  Relation Name  Status   Mother  Alive   Father  Deceased at age 1   Brother  Alive   MGM  Deceased   MGF  Deceased   PGM  Deceased   PGF  Deceased   Pat Aunt  Deceased   Pat Aunt  Alive   Neg Hx  (Not Specified)  No partnership data on file   Family History  Problem Relation Age of Onset   Congestive Heart Failure Father    Kidney failure Father    Diabetes Mellitus II Paternal Aunt    AAA (abdominal aortic aneurysm) Paternal Aunt    Stroke Neg Hx    Heart disease Neg Hx    Mental illness Neg Hx    No Known Allergies    Review of Systems  Constitutional:  Negative for fever and malaise/fatigue.  HENT:  Negative for congestion.   Eyes:  Negative for blurred vision.  Respiratory:  Negative for shortness of breath.   Cardiovascular:  Negative for chest pain, palpitations and leg swelling.  Gastrointestinal:  Negative for abdominal pain, blood in stool and nausea.  Genitourinary:  Negative for dysuria and frequency.  Musculoskeletal:  Negative for falls.  Skin:  Negative for rash.  Neurological:  Negative for dizziness, loss of consciousness and headaches.  Endo/Heme/Allergies:  Negative for environmental allergies.  Psychiatric/Behavioral:  Negative for depression. The patient is not nervous/anxious.       Objective:     BP 110/80 (BP Location: Left Arm, Patient Position: Sitting, Cuff Size: Large)   Pulse 76   Temp 98.4 F (36.9 C) (Oral)   Resp 16   Ht 5\' 5"  (1.651 m)   Wt 186 lb 9.6 oz (84.6 kg)   SpO2 98%   BMI 31.05 kg/m  BP Readings from Last 3 Encounters:  08/07/23 110/80  05/08/23 (!) 120/90  01/17/23 112/80   Wt Readings from Last 3 Encounters:  08/07/23 186 lb 9.6 oz (84.6 kg)  05/08/23 198 lb 12.8 oz (90.2 kg)  01/17/23 184 lb 6.4 oz (83.6 kg)   SpO2 Readings from Last 3 Encounters:  08/07/23 98%  05/08/23 98%  01/17/23 96%      Physical Exam Vitals and nursing note reviewed.  Constitutional:      General: She is not in acute distress.     Appearance: Normal appearance. She is well-developed.  HENT:     Head: Normocephalic and atraumatic.  Eyes:     General: No scleral icterus.       Right eye: No discharge.        Left eye: No discharge.  Cardiovascular:     Rate and Rhythm: Normal rate and regular rhythm.     Heart sounds: No murmur heard. Pulmonary:     Effort: Pulmonary effort is normal. No  respiratory distress.     Breath sounds: Normal breath sounds.  Musculoskeletal:        General: Normal range of motion.     Cervical back: Normal range of motion and neck supple.     Right lower leg: No edema.     Left lower leg: No edema.  Skin:    General: Skin is warm and dry.  Neurological:     General: No focal deficit present.     Mental Status: She is alert and oriented to person, place, and time.  Psychiatric:        Mood and Affect: Mood normal.        Behavior: Behavior normal.        Thought Content: Thought content normal.        Judgment: Judgment normal.      No results found for any visits on 08/07/23.  Last CBC Lab Results  Component Value Date   WBC 7.3 12/05/2022   HGB 12.6 12/05/2022   HCT 38.3 12/05/2022   MCV 92.9 12/05/2022   MCH 29.6 04/02/2021   RDW 13.8 12/05/2022   PLT 155.0 12/05/2022   Last metabolic panel Lab Results  Component Value Date   GLUCOSE 72 12/05/2022   NA 137 12/05/2022   K 4.3 12/05/2022   CL 103 12/05/2022   CO2 27 12/05/2022   BUN 7 12/05/2022   CREATININE 0.74 12/05/2022   GFR 98.49 12/05/2022   CALCIUM 9.3 12/05/2022   PROT 6.9 12/05/2022   ALBUMIN 3.6 12/05/2022   BILITOT 0.4 12/05/2022   ALKPHOS 36 (L) 12/05/2022   AST 10 12/05/2022   ALT 8 12/05/2022   ANIONGAP 5 07/19/2014   Last lipids Lab Results  Component Value Date   CHOL 180 12/05/2022   HDL 44.30 12/05/2022   LDLCALC 126 (H) 12/05/2022   TRIG 51.0 12/05/2022   CHOLHDL 4 12/05/2022   Last hemoglobin A1c Lab Results  Component Value Date   HGBA1C 5.4 09/13/2022   Last thyroid   functions Lab Results  Component Value Date   TSH 0.92 12/05/2022   Last vitamin D  Lab Results  Component Value Date   VD25OH 31.48 12/05/2022   Last vitamin B12 and Folate Lab Results  Component Value Date   VITAMINB12 167 (L) 12/05/2022      The 10-year ASCVD risk score (Arnett DK, et al., 2019) is: 0.6%    Assessment & Plan:   Problem List Items Addressed This Visit       Unprioritized   Obesity (BMI 30.0-34.9)   Check labs today Con't exercise and diet  Decrease zepbound  to 5 mg weekly  F/u 3 months       Other Visit Diagnoses       Obesity (BMI 35.0-39.9 without comorbidity)    -  Primary   Relevant Medications   tirzepatide  5 MG/0.5ML injection vial   Other Relevant Orders   CBC with Differential/Platelet   Comprehensive metabolic panel with GFR   Lipid panel   TSH   VITAMIN D  25 Hydroxy (Vit-D Deficiency, Fractures)   Vitamin B12   Hemoglobin A1c   Insulin , random     Insulin  resistance       Relevant Medications   tirzepatide  5 MG/0.5ML injection vial   Other Relevant Orders   CBC with Differential/Platelet   Comprehensive metabolic panel with GFR   Lipid panel   TSH   VITAMIN D  25 Hydroxy (Vit-D Deficiency, Fractures)   Vitamin B12   Hemoglobin A1c  Insulin , random     Vitamin D  deficiency       Relevant Orders   VITAMIN D  25 Hydroxy (Vit-D Deficiency, Fractures)     Assessment and Plan Assessment & Plan Rash   The rash on the inside of both elbows has resolved, likely due to the increase in Zepbound  dosage from 5 mg to 7.5 mg. It was not severe and resolved after skipping a dose. She is considering reverting to the 5 mg dosage, pending insurance approval. Submit a request to insurance for approval to revert to 5 mg Zepbound  dosage.  Constipation   Constipation is managed with Miralax and occasional Dulcolax tablet, noted as a side effect of her current medication regimen. She reports effective management with this regimen. Continue  Miralax as needed and use Dulcolax tablet occasionally as needed.    No follow-ups on file.    Azarius Lambson R Lowne Chase, DO

## 2023-08-07 NOTE — Assessment & Plan Note (Signed)
 Check labs today Con't exercise and diet  Decrease zepbound  to 5 mg weekly  F/u 3 months

## 2023-08-08 LAB — INSULIN, RANDOM: Insulin: 7.2 u[IU]/mL

## 2023-08-11 ENCOUNTER — Telehealth: Payer: Self-pay | Admitting: Neurology

## 2023-08-11 NOTE — Telephone Encounter (Signed)
 Copied from CRM [id number]. Topic: Clinical - Lab/Test Results >> Aug 11, 2023  1:57 PM Martinique E wrote: Reason for CRM: Patient called in regarding her lab results. Patient would greatly appreciate if she could speak with PCP's nurse regarding these results and next steps for her to take, if any. Callback number for patient is [phone].

## 2023-08-11 NOTE — Telephone Encounter (Signed)
Labs not reviewed by pcp yet

## 2023-08-14 ENCOUNTER — Ambulatory Visit: Payer: Self-pay | Admitting: Family Medicine

## 2023-08-16 MED ORDER — VITAMIN D (ERGOCALCIFEROL) 1.25 MG (50000 UNIT) PO CAPS: 50000.0000 [IU] | ORAL_CAPSULE | ORAL | 2 refills | Status: AC

## 2023-08-26 ENCOUNTER — Emergency Department (HOSPITAL_COMMUNITY)
Admission: EM | Admit: 2023-08-26 | Discharge: 2023-08-26 | Disposition: A | Discharge status: Home / Self Care | Attending: Emergency Medicine | Admitting: Emergency Medicine

## 2023-08-26 ENCOUNTER — Other Ambulatory Visit: Payer: Self-pay

## 2023-08-26 ENCOUNTER — Emergency Department (HOSPITAL_COMMUNITY)

## 2023-08-26 DIAGNOSIS — G40909 Epilepsy, unspecified, not intractable, without status epilepticus: Secondary | ICD-10-CM | POA: Insufficient documentation

## 2023-08-26 DIAGNOSIS — R410 Disorientation, unspecified: Secondary | ICD-10-CM | POA: Diagnosis not present

## 2023-08-26 DIAGNOSIS — R569 Unspecified convulsions: Secondary | ICD-10-CM | POA: Diagnosis not present

## 2023-08-26 DIAGNOSIS — R000 Tachycardia, unspecified: Secondary | ICD-10-CM | POA: Diagnosis not present

## 2023-08-26 DIAGNOSIS — I10 Essential (primary) hypertension: Secondary | ICD-10-CM | POA: Diagnosis not present

## 2023-08-26 LAB — CBC WITH DIFFERENTIAL/PLATELET
Abs Immature Granulocytes: 0.03 10*3/uL (ref 0.00–0.07)
Basophils Absolute: 0.1 10*3/uL (ref 0.0–0.1)
Basophils Relative: 1 %
Eosinophils Absolute: 0.1 10*3/uL (ref 0.0–0.5)
Eosinophils Relative: 1 %
HCT: 38.1 % (ref 36.0–46.0)
Hemoglobin: 12.6 g/dL (ref 12.0–15.0)
Immature Granulocytes: 0 %
Lymphocytes Relative: 20 %
Lymphs Abs: 1.7 10*3/uL (ref 0.7–4.0)
MCH: 31.3 pg (ref 26.0–34.0)
MCHC: 33.1 g/dL (ref 30.0–36.0)
MCV: 94.8 fL (ref 80.0–100.0)
Monocytes Absolute: 0.7 10*3/uL (ref 0.1–1.0)
Monocytes Relative: 9 %
Neutro Abs: 5.8 10*3/uL (ref 1.7–7.7)
Neutrophils Relative %: 69 %
Platelets: 227 10*3/uL (ref 150–400)
RBC: 4.02 MIL/uL (ref 3.87–5.11)
RDW: 13.3 % (ref 11.5–15.5)
WBC: 8.4 10*3/uL (ref 4.0–10.5)
nRBC: 0 % (ref 0.0–0.2)

## 2023-08-26 LAB — BASIC METABOLIC PANEL WITH GFR
Anion gap: 9 (ref 5–15)
BUN: 11 mg/dL (ref 6–20)
CO2: 24 mmol/L (ref 22–32)
Calcium: 9.1 mg/dL (ref 8.9–10.3)
Chloride: 103 mmol/L (ref 98–111)
Creatinine, Ser: 0.77 mg/dL (ref 0.44–1.00)
GFR, Estimated: >60 mL/min (ref >60)
Glucose, Bld: 102 mg/dL — ABNORMAL HIGH (ref 70–99)
Potassium: 3.4 mmol/L — ABNORMAL LOW (ref 3.5–5.1)
Sodium: 136 mmol/L (ref 135–145)

## 2023-08-26 LAB — MAGNESIUM: Magnesium: 1.7 mg/dL (ref 1.7–2.4)

## 2023-08-26 LAB — HCG, SERUM, QUALITATIVE: Preg, Serum: NEGATIVE

## 2023-08-26 MED ORDER — LEVETIRACETAM 500 MG PO TABS: 500.0000 mg | ORAL_TABLET | Freq: Two times a day (BID) | ORAL | 0 refills | Status: DC

## 2023-08-26 MED ORDER — LEVETIRACETAM (KEPPRA) 500 MG/5 ML ADULT IV PUSH
1000.0000 mg | Freq: Once | INTRAVENOUS | Status: AC
  Administered 2023-08-26: 1000 mg via INTRAVENOUS
  Filled 2023-08-26: qty 10

## 2023-08-26 NOTE — ED Triage Notes (Signed)
 Pt BIBA from home for seizure lasting ~11m. Had 1 seizure 8 years ago, no longer taking meds, possibly related to phentermine. May have had recent seizures in sleep unwitnessed.   120 ST 138/98 Cbg 104 98% RA

## 2023-08-26 NOTE — Discharge Instructions (Addendum)
 No driving for 6 months or until cleared by neurology. Please call your neurologist to be seen your seizures.

## 2023-08-26 NOTE — ED Provider Notes (Signed)
 [facility] EMERGENCY DEPARTMENT AT [facility] Provider Note   CSN: [id number] Arrival date & time: 08/26/23  0327     History  Chief Complaint  Patient presents with   Seizures    Veronica Stanton is a 43 y.o. female.  The history is provided by the patient and the EMS personnel.  Seizures Seizure activity on arrival: no   Seizure type:  Unable to specify (in sleep tonight and possible twice in the previous month.  was on seizure medication in the past but is no longer on this medication.) Preceding symptoms: no vision change   Initial focality:  None Episode characteristics: tongue biting   Return to baseline: yes   Timing:  Unable to specify Progression:  Resolved Context: not intracranial lesion, medical compliance, not pregnant and not previous head injury        Home Medications Prior to Admission medications   Medication Sig Start Date End Date Taking? Authorizing Provider  levETIRAcetam (KEPPRA) 500 MG tablet Take 1 tablet (500 mg total) by mouth 2 (two) times daily. 08/26/23  Yes Aundria Bitterman, MD  tirzepatide  (ZEPBOUND ) 7.5 MG/0.5ML Pen Inject 7.5 mg into the skin once a week. 07/19/23  Yes Roel Clarity R, DO  ammonium lactate  (AMLACTIN) 12 % lotion Apply 1 Application topically as needed for dry skin (apply thin film twice daily.). Patient not taking: Reported on 08/26/2023 02/09/22   Saguier, Gaylin Ke, PA-C  clonazePAM  (KLONOPIN ) 0.5 MG tablet Take 1 tablet (0.5 mg total) by mouth 2 (two) times daily as needed for anxiety. Patient not taking: Reported on 08/26/2023 08/12/20   Saguier, Gaylin Ke, PA-C  clotrimazole -betamethasone  (LOTRISONE ) cream Apply 1 application topically daily. Patient not taking: Reported on 08/26/2023 04/02/21   Estill Hemming, DO  fluticasone  (FLONASE ) 50 MCG/ACT nasal spray Place 2 sprays into both nostrils daily. Patient not taking: Reported on 08/26/2023 03/17/20   Roel Clarity R, DO  loratadine  (CLARITIN ) 10 MG  tablet Take 1 tablet (10 mg total) by mouth daily. Patient not taking: Reported on 08/26/2023 03/17/20   Estill Hemming, DO  metFORMIN  (GLUCOPHAGE ) 500 MG tablet Take 1 tablet (500 mg total) by mouth daily with breakfast. Patient not taking: Reported on 08/26/2023 05/11/21   Estill Hemming, DO  tirzepatide  5 MG/0.5ML injection vial Inject 5 mg into the skin once a week. Patient not taking: Reported on 08/26/2023 08/07/23   Crecencio Dodge, Adel Holt R, DO  topiramate  (TOPAMAX ) 25 MG tablet Take 3 tablets daily Patient not taking: Reported on 08/26/2023 07/12/21   Jhonny Moss, MD  triamcinolone  cream (KENALOG ) 0.1 % Apply 1 Application topically 2 (two) times daily. Patient not taking: Reported on 08/26/2023 03/07/23   Roel Clarity R, DO  Vitamin D , Ergocalciferol , (DRISDOL ) 1.25 MG (50000 UNIT) CAPS capsule Take 1 capsule (50,000 Units total) by mouth every 7 (seven) days. Patient not taking: Reported on 08/26/2023 08/16/23   Lowne Chase, Yvonne R, DO      Allergies    Patient has no known allergies.    Review of Systems   Review of Systems  Constitutional:  Negative for fever.  Neurological:  Positive for seizures.  All other systems reviewed and are negative.   Physical Exam Updated Vital Signs BP 130/77   Pulse (!) 115   Temp 97.7 F (36.5 C) (Oral)   Resp 19   SpO2 100%  Physical Exam Vitals and nursing note reviewed. Exam conducted with a chaperone present.  Constitutional:      General: She is not in acute distress.    Appearance: Normal appearance. She is well-developed.  HENT:     Head: Normocephalic and atraumatic.     Nose: Nose normal.  Eyes:     Pupils: Pupils are equal, round, and reactive to light.  Cardiovascular:     Rate and Rhythm: Normal rate and regular rhythm.     Pulses: Normal pulses.     Heart sounds: Normal heart sounds.  Pulmonary:     Effort: Pulmonary effort is normal. No respiratory distress.     Breath sounds: Normal breath sounds.   Abdominal:     General: Bowel sounds are normal. There is no distension.     Palpations: Abdomen is soft.     Tenderness: There is no abdominal tenderness. There is no guarding or rebound.  Musculoskeletal:        General: Normal range of motion.     Cervical back: Normal range of motion and neck supple.  Skin:    General: Skin is warm and dry.     Capillary Refill: Capillary refill takes less than 2 seconds.     Findings: No erythema or rash.  Neurological:     General: No focal deficit present.     Mental Status: She is alert.     Deep Tendon Reflexes: Reflexes normal.  Psychiatric:        Thought Content: Thought content normal.     ED Results / Procedures / Treatments   Labs (all labs ordered are listed, but only abnormal results are displayed) Results for orders placed or performed during the hospital encounter of 08/26/23  CBC with Differential   Collection Time: 08/26/23  3:36 AM  Result Value Ref Range   WBC 8.4 4.0 - 10.5 K/uL   RBC 4.02 3.87 - 5.11 MIL/uL   Hemoglobin 12.6 12.0 - 15.0 g/dL   HCT 29.5 62.1 - 30.8 %   MCV 94.8 80.0 - 100.0 fL   MCH 31.3 26.0 - 34.0 pg   MCHC 33.1 30.0 - 36.0 g/dL   RDW 65.7 84.6 - 96.2 %   Platelets 227 150 - 400 K/uL   nRBC 0.0 0.0 - 0.2 %   Neutrophils Relative % 69 %   Neutro Abs 5.8 1.7 - 7.7 K/uL   Lymphocytes Relative 20 %   Lymphs Abs 1.7 0.7 - 4.0 K/uL   Monocytes Relative 9 %   Monocytes Absolute 0.7 0.1 - 1.0 K/uL   Eosinophils Relative 1 %   Eosinophils Absolute 0.1 0.0 - 0.5 K/uL   Basophils Relative 1 %   Basophils Absolute 0.1 0.0 - 0.1 K/uL   Immature Granulocytes 0 %   Abs Immature Granulocytes 0.03 0.00 - 0.07 K/uL  Basic metabolic panel   Collection Time: 08/26/23  3:36 AM  Result Value Ref Range   Sodium 136 135 - 145 mmol/L   Potassium 3.4 (L) 3.5 - 5.1 mmol/L   Chloride 103 98 - 111 mmol/L   CO2 24 22 - 32 mmol/L   Glucose, Bld 102 (H) 70 - 99 mg/dL   BUN 11 6 - 20 mg/dL   Creatinine, Ser 9.52  0.44 - 1.00 mg/dL   Calcium 9.1 8.9 - 84.1 mg/dL   GFR, Estimated >32 >44 mL/min   Anion gap 9 5 - 15  hCG, serum, qualitative   Collection Time: 08/26/23  3:36 AM  Result Value Ref Range   Preg, Serum NEGATIVE NEGATIVE  Magnesium   Collection Time: 08/26/23  3:36 AM  Result Value Ref Range   Magnesium 1.7 1.7 - 2.4 mg/dL   CT Head Wo Contrast Result Date: 08/26/2023 CLINICAL DATA:  43 year old female status post witnessed seizure. Most recent seizure 8 years ago. EXAM: CT HEAD WITHOUT CONTRAST TECHNIQUE: Contiguous axial images were obtained from the base of the skull through the vertex without intravenous contrast. RADIATION DOSE REDUCTION: This exam was performed according to the departmental dose-optimization program which includes automated exposure control, adjustment of the mA and/or kV according to patient size and/or use of iterative reconstruction technique. COMPARISON:  Brain MRI 08/14/2014. FINDINGS: Brain: Cerebral volume stable and normal. No midline shift, ventriculomegaly, mass effect, evidence of mass lesion, intracranial hemorrhage or evidence of cortically based acute infarction. Gray-white matter differentiation is within normal limits throughout the brain. Faint bilateral basal ganglia vascular calcifications. Vascular: No suspicious intracranial vascular hyperdensity. Skull: Intact, negative. Sinuses/Orbits: Visualized paranasal sinuses and mastoids are clear. Other: Visualized orbits and scalp soft tissues are within normal limits. IMPRESSION: Normal noncontrast Head CT. Electronically Signed   By: Marlise Simpers M.D.   On: 08/26/2023 05:19     EKG EKG Interpretation Date/Time:  Saturday Aug 26 2023 03:39:23 EDT Ventricular Rate:  117 PR Interval:  154 QRS Duration:  72 QT Interval:  310 QTC Calculation: 433 R Axis:   45  Text Interpretation: Sinus tachycardia Confirmed by Nioka Thorington ([id number]) on 08/26/2023 4:19:05 AM  Radiology CT Head Wo Contrast Result Date:  08/26/2023 CLINICAL DATA:  43 year old female status post witnessed seizure. Most recent seizure 8 years ago. EXAM: CT HEAD WITHOUT CONTRAST TECHNIQUE: Contiguous axial images were obtained from the base of the skull through the vertex without intravenous contrast. RADIATION DOSE REDUCTION: This exam was performed according to the departmental dose-optimization program which includes automated exposure control, adjustment of the mA and/or kV according to patient size and/or use of iterative reconstruction technique. COMPARISON:  Brain MRI 08/14/2014. FINDINGS: Brain: Cerebral volume stable and normal. No midline shift, ventriculomegaly, mass effect, evidence of mass lesion, intracranial hemorrhage or evidence of cortically based acute infarction. Gray-white matter differentiation is within normal limits throughout the brain. Faint bilateral basal ganglia vascular calcifications. Vascular: No suspicious intracranial vascular hyperdensity. Skull: Intact, negative. Sinuses/Orbits: Visualized paranasal sinuses and mastoids are clear. Other: Visualized orbits and scalp soft tissues are within normal limits. IMPRESSION: Normal noncontrast Head CT. Electronically Signed   By: Marlise Simpers M.D.   On: 08/26/2023 05:19    Procedures Procedures    Medications Ordered in ED Medications  levETIRAcetam (KEPPRA) undiluted injection 1,000 mg (1,000 mg Intravenous Given 08/26/23 0509)    ED Course/ Medical Decision Making/ A&P                                 Medical Decision Making Brought in by EMS for seizure while asleep, may have had 2 others in the past month also in sleep   Amount and/or Complexity of Data Reviewed Independent Historian: EMS    Details: See above  External Data Reviewed: notes.    Details: Previous notes reviewed  Labs: ordered.    Details: Normal white count 8.4, normal hemoglobin 12.6, normal platelets. Negative pregnancy. Normal sodium 136,  potassium 3.4, normal creatinine.  Normal  magnesium  Radiology: ordered and independent interpretation performed.    Details: Normal CT ECG/medicine tests: ordered and independent interpretation performed. Decision-making details documented in ED  Course. Discussion of management or test interpretation with external provider(s): Case d/w Dr. Lindzen, load with keppra. start keppra 500 mg twice daily.  No driving follow up with Dr. Ty Gales   Risk Prescription drug management. Risk Details: I have loaded with keppra and started oral keppra.  Patient instructed verbally with nurse Larinda Plover present that she cannot drive for 6 months or until cleared by neurology.  This was also printed on discharge paperwork.  Patient wanted to know if she can fly this weekend.  I stated that I cannot guarantee that she will not have another seizure on the starting dose of Keppra. All questions answered to the patient's satisfaction.  Please contact Dr. Ty Gales for close follow up.  Strict return     Final Clinical Impression(s) / ED Diagnoses Final diagnoses:  Seizure disorder (HCC)   No signs of systemic illness or infection. The patient is nontoxic-appearing on exam and vital signs are within normal limits.  I have reviewed the triage vital signs and the nursing notes. Pertinent labs & imaging results that were available during my care of the patient were reviewed by me and considered in my medical decision making (see chart for details). After history, exam, and medical workup I feel the patient has been appropriately medically screened and is safe for discharge home. Pertinent diagnoses were discussed with the patient. Patient was given return precautions.  Rx / DC Orders ED Discharge Orders          Ordered    levETIRAcetam (KEPPRA) 500 MG tablet  2 times daily        08/26/23 0609              Autymn Omlor, MD 08/26/23 [id number]

## 2023-08-26 NOTE — ED Notes (Addendum)
 RN informed patient that she can't drive for 6 months or until cleared by neurology.

## 2023-08-26 NOTE — ED Notes (Signed)
 Pt is on zepbound  and does NOT want her family to know

## 2023-08-31 ENCOUNTER — Encounter: Payer: Self-pay | Admitting: Neurology

## 2023-09-07 ENCOUNTER — Encounter: Payer: Self-pay | Admitting: Family Medicine

## 2023-09-07 ENCOUNTER — Ambulatory Visit: Admitting: Family Medicine

## 2023-09-07 VITALS — BP 130/80 | HR 90 | Temp 98.3°F | Resp 16 | Ht 65.0 in | Wt 184.0 lb

## 2023-09-07 DIAGNOSIS — G40909 Epilepsy, unspecified, not intractable, without status epilepticus: Secondary | ICD-10-CM

## 2023-09-07 DIAGNOSIS — Z6830 Body mass index (BMI) 30.0-30.9, adult: Secondary | ICD-10-CM

## 2023-09-07 DIAGNOSIS — E66811 Obesity, class 1: Secondary | ICD-10-CM | POA: Diagnosis not present

## 2023-09-07 DIAGNOSIS — E88819 Insulin resistance, unspecified: Secondary | ICD-10-CM

## 2023-09-07 MED ORDER — LEVETIRACETAM 500 MG PO TABS: 500.0000 mg | ORAL_TABLET | Freq: Two times a day (BID) | ORAL | 0 refills | Status: DC

## 2023-09-07 MED ORDER — ZEPBOUND 7.5 MG/0.5ML ~~LOC~~ SOAJ
7.5000 mg | SUBCUTANEOUS | 0 refills | Status: DC
Start: 2023-09-07 — End: 2023-10-30

## 2023-09-07 NOTE — Assessment & Plan Note (Signed)
 REFILL ZEPOUND RETURN TO OFFICE 3 MONTHS

## 2023-09-07 NOTE — Progress Notes (Signed)
 Established Patient Office Visit  Subjective   Patient ID: Veronica Stanton, female    DOB: Mar 08, 1981  Age: 43 y.o. MRN: [id number]  Chief Complaint  Patient presents with   Seizures    X2 weeks, pt states having a seizure in her sleep, pt thinks she have had more because she woke up with bites on her tongue.  Pt's daughter found patient having seizure in bed. Pt was seen at ED and was started on Keppra     HPI Discussed the use of AI scribe software for clinical note transcription with the patient, who gave verbal consent to proceed.  History of Present Illness   Veronica Stanton is a 43 year old female with a seizure disorder who presents with a recent seizure episode.  On Aug 26, 2023, she experienced a seizure during which her daughter heard unusual noises and found her unresponsive. Emergency services transported her to [facility], where her vitals were normal. She was prescribed Keppra , 500 mg twice daily, which she finds effective without cognitive side effects.  Her seizure disorder history includes a previous episode in 2015 attributed to phentermine use. She discontinued phentermine and was prescribed topiramate , which she stopped due to cognitive slowing. She had no seizures until the recent episode.  Prior to the recent seizure, she experienced episodes of waking with the left side of her tongue feeling thin, initially attributed to teeth grinding. She also recalls an incident three weeks prior where she awoke disoriented, not recognizing a Curator, suggesting a possible seizure.  She is currently taking Zepbound  7.5 mg for weight management and reports feeling well overall, with good energy levels and no further seizure episodes since starting Keppra . She is managing a vitamin D  deficiency with weekly MND.  She has a sedentary job that requires travel and plans to increase her physical activity. Her daughter, Veronica Stanton, is 58 years old and studying nursing.      Patient  Active Problem List   Diagnosis Date Noted   Slow transit constipation 01/17/2023   Dysuria 09/24/2021   High risk heterosexual behavior 04/02/2021   Morbid obesity (HCC) 04/02/2021   Hyperpigmentation 04/02/2021   Eczema 03/03/2021   COVID-19 11/24/2020   Paroxysmal tachycardia (HCC) 06/15/2020   Lower extremity edema 06/15/2020   Obesity (BMI 30.0-34.9) 10/21/2019   History of seizures 01/03/2019   Altered level of consciousness 02/16/2018   Left arm weakness 06/08/2016   Localization-related idiopathic epilepsy and epileptic syndromes with seizures of localized onset, not intractable, without status epilepticus (HCC) 09/02/2014   Awareness alteration, transient 07/30/2014   Faintness 07/30/2014   Syncope 07/28/2014   Rapid palpitations 11/16/2011   Dyspnea 11/16/2011   Past Medical History:  Diagnosis Date   No diagnosis    Palpitations    Past Surgical History:  Procedure Laterality Date   negative     Social History   Tobacco Use   Smoking status: Never   Smokeless tobacco: Never  Vaping Use   Vaping status: Never Used  Substance Use Topics   Alcohol use: No   Drug use: No   Social History   Socioeconomic History   Marital status: Single    Spouse name: Not on file   Number of children: Not on file   Years of education: Not on file   Highest education level: Not on file  Occupational History   Not on file  Tobacco Use   Smoking status: Never   Smokeless tobacco: Never  Vaping Use  Vaping status: Never Used  Substance and Sexual Activity   Alcohol use: No   Drug use: No   Sexual activity: Not on file  Other Topics Concern   Not on file  Social History Narrative   Not on file   Social Drivers of Health   Financial Resource Strain: Not on file  Food Insecurity: Not on file  Transportation Needs: Not on file  Physical Activity: Not on file  Stress: Not on file  Social Connections: Unknown (11/24/2022)   [facility] from [facility]   Social  Network    Social Network: Not on file  Intimate Partner Violence: Unknown (11/24/2022)   Received from [facility]   HITS    Physically Hurt: Not on file    Insult or Talk Down To: Not on file    Threaten Physical Harm: Not on file    Scream or Curse: Not on file   Family Status  Relation Name Status   Mother  Alive   Father  Deceased at age 75   Brother  Alive   MGM  Deceased   MGF  Deceased   PGM  Deceased   PGF  Deceased   Pat Aunt  Deceased   Pat Aunt  Alive   Neg Hx  (Not Specified)  No partnership data on file   Family History  Problem Relation Age of Onset   Congestive Heart Failure Father    Kidney failure Father    Diabetes Mellitus II Paternal Aunt    AAA (abdominal aortic aneurysm) Paternal Aunt    Stroke Neg Hx    Heart disease Neg Hx    Mental illness Neg Hx    No Known Allergies    Review of Systems  Constitutional:  Negative for fever and malaise/fatigue.  HENT:  Negative for congestion.   Eyes:  Negative for blurred vision.  Respiratory:  Negative for shortness of breath.   Cardiovascular:  Negative for chest pain, palpitations and leg swelling.  Gastrointestinal:  Negative for abdominal pain, blood in stool and nausea.  Genitourinary:  Negative for dysuria and frequency.  Musculoskeletal:  Negative for falls.  Skin:  Negative for rash.  Neurological:  Negative for dizziness, loss of consciousness and headaches.  Endo/Heme/Allergies:  Negative for environmental allergies.  Psychiatric/Behavioral:  Negative for depression. The patient is not nervous/anxious.       Objective:     BP 130/80 (BP Location: Left Arm, Patient Position: Sitting, Cuff Size: Normal)   Pulse 90   Temp 98.3 F (36.8 C) (Oral)   Resp 16   Ht 5' 5 (1.651 m)   Wt 184 lb (83.5 kg)   SpO2 98%   BMI 30.62 kg/m  BP Readings from Last 3 Encounters:  09/07/23 130/80  08/26/23 130/77  08/07/23 110/80   Wt Readings from Last 3 Encounters:  09/07/23 184 lb (83.5 kg)   08/07/23 186 lb 9.6 oz (84.6 kg)  05/08/23 198 lb 12.8 oz (90.2 kg)   SpO2 Readings from Last 3 Encounters:  09/07/23 98%  08/26/23 100%  08/07/23 98%      Physical Exam Vitals and nursing note reviewed.  Constitutional:      General: She is not in acute distress.    Appearance: Normal appearance. She is well-developed.  HENT:     Head: Normocephalic and atraumatic.   Eyes:     General: No scleral icterus.       Right eye: No discharge.  Left eye: No discharge.    Cardiovascular:     Rate and Rhythm: Normal rate and regular rhythm.     Heart sounds: No murmur heard. Pulmonary:     Effort: Pulmonary effort is normal. No respiratory distress.     Breath sounds: Normal breath sounds.   Musculoskeletal:        General: Normal range of motion.     Cervical back: Normal range of motion and neck supple.     Right lower leg: No edema.     Left lower leg: No edema.   Skin:    General: Skin is warm and dry.   Neurological:     Mental Status: She is alert and oriented to person, place, and time.   Psychiatric:        Mood and Affect: Mood normal.        Behavior: Behavior normal.        Thought Content: Thought content normal.        Judgment: Judgment normal.      No results found for any visits on 09/07/23.  Last CBC Lab Results  Component Value Date   WBC 8.4 08/26/2023   HGB 12.6 08/26/2023   HCT 38.1 08/26/2023   MCV 94.8 08/26/2023   MCH 31.3 08/26/2023   RDW 13.3 08/26/2023   PLT 227 08/26/2023   Last metabolic panel Lab Results  Component Value Date   GLUCOSE 102 (H) 08/26/2023   NA 136 08/26/2023   K 3.4 (L) 08/26/2023   CL 103 08/26/2023   CO2 24 08/26/2023   BUN 11 08/26/2023   CREATININE 0.77 08/26/2023   GFRNONAA >60 08/26/2023   CALCIUM 9.1 08/26/2023   PROT 7.3 08/07/2023   ALBUMIN 4.0 08/07/2023   BILITOT 0.3 08/07/2023   ALKPHOS 36 (L) 08/07/2023   AST 12 08/07/2023   ALT 11 08/07/2023   ANIONGAP 9 08/26/2023   Last  lipids Lab Results  Component Value Date   CHOL 192 08/07/2023   HDL 49.60 08/07/2023   LDLCALC 136 (H) 08/07/2023   TRIG 35.0 08/07/2023   CHOLHDL 4 08/07/2023   Last hemoglobin A1c Lab Results  Component Value Date   HGBA1C 5.3 08/07/2023   Last thyroid  functions Lab Results  Component Value Date   TSH 1.05 08/07/2023   Last vitamin D  Lab Results  Component Value Date   VD25OH 23.05 (L) 08/07/2023   Last vitamin B12 and Folate Lab Results  Component Value Date   VITAMINB12 195 (L) 08/07/2023      The 10-year ASCVD risk score (Arnett DK, et al., 2019) is: 1.4%    Assessment & Plan:   Problem List Items Addressed This Visit       Unprioritized   Obesity (BMI 30.0-34.9)   REFILL ZEPOUND RETURN TO OFFICE 3 MONTHS      Relevant Medications   tirzepatide  (ZEPBOUND ) 7.5 MG/0.5ML Pen   Other Visit Diagnoses       Insulin  resistance    -  Primary   Relevant Medications   tirzepatide  (ZEPBOUND ) 7.5 MG/0.5ML Pen     Seizure disorder (HCC)       Relevant Medications   levETIRAcetam  (KEPPRA ) 500 MG tablet     Assessment and Plan    Seizure disorder   She experiences recurrent seizures, with the latest episode on Aug 26, 2023, marked by unawareness and unusual noises, confirmed by emergency services. A previous seizure in 2015 was linked to phentermine use. She is currently on Keppra  (  levetiracetam ) 500 mg twice daily, which she tolerates well without cognitive side effects. Hypoglycemia is a possible trigger. A neurologist follow-up is scheduled for November 20, 2023. Informed consent for Keppra  includes no significant interaction with Zepbound  and no cognitive side effects, important for her job performance. Continue Keppra  500 mg twice daily, ensure adequate dietary intake to prevent hypoglycemia, and follow up with the neurologist on November 20, 2023.  Weight management   Her weight is 184 lbs, within her desired range. She is on Zepbound  7.5 mg, which is  effective and approved by insurance. She plans to increase physical activity despite a sedentary job. Maintain Zepbound  at 7.5 mg due to insurance approval and to avoid potential coverage issues. Encourage increased physical activity.  Vitamin D  deficiency   Recent labs indicate vitamin D  deficiency. She is not currently taking over-the-counter vitamin D  supplementation. Start vitamin D3 1000 IU daily over-the-counter to support overall health.  Hypertension   Her blood pressure is 130/80 mmHg, within an acceptable range. Monitor blood pressure regularly.        Return in about 3 months (around 12/08/2023), or if symptoms worsen or fail to improve.     Glenard Keesling R Lowne Chase, DO

## 2023-09-07 NOTE — Patient Instructions (Signed)
 Seizure, Adult A seizure is a sudden burst of abnormal activity in the brain. Seizures usually last from 30 seconds to 2 minutes. There are many types of seizures. And they can cause many different symptoms. What are the causes? Common causes of a seizure include: Fever or infection. Problems that affect the brain. These may include: A brain or head injury. A stroke. A brain tumor. Low levels of blood sugar or salt. Kidney problems or liver problems. Some inherited conditions. These are passed down from parent to child. Problems with a substance, such as: Having a reaction to a drug or a medicine. Stopping the use of a substance all of a sudden. When this causes problems, it's called withdrawal. Disorders that affect how you develop, such as autism spectrum disorder or cerebral palsy. Sometimes, the cause may not be known. Some people who have a seizure never have another one. A person who has repeated seizures over time without a clear cause has a condition called epilepsy. What increases the risk? Having a family history of epilepsy. Having had a tonic-clonic seizure before. This type of seizure causes: The muscles of the whole body to tighten, or contract. Loss of consciousness. Having a head injury or a stroke in the past. Having had too little oxygen at birth. What are the signs or symptoms? The symptoms vary depending on the type of seizure you have. Symptoms during a seizure Having convulsions. This means shaking with fast, jerky movements of muscles. Stiffness of the body. Breathing problems. Being confused. Staring or not responding to sound or touch. Head nodding, eye blinking, eye twitching, or fast eye movements. Drooling, grunting, or making clicking sounds with your mouth. Losing control of when you pee or poop. Symptoms before a seizure Feeling afraid, worried, or nervous. Feeling like you may vomit. Vertigo. This feels like: You are moving when you're  not. Things around you are moving when they're not. Dj vu. This is a feeling of having seen or heard something before. Odd tastes or smells. Changes in how you see. You may see flashing lights or spots. Symptoms after a seizure Being confused. Feeling sleepy. Headache. Sore muscles. How is this diagnosed? A seizure may be diagnosed based on: A description of your symptoms. Video of your seizures can be helpful. Your medical history. A physical exam. Tests, such as: Blood tests. CT scan. MRI. Electroencephalogram, or EEG. This test measures electrical activity in the brain. A test of your spinal fluid. This is called a spinal tap or lumbar puncture. How is this treated? If your seizure stops on its own, you will not need treatment. If your seizure lasts longer than 5 minutes, you'll normally need treatment. This may include: Medicines given through an IV. Avoiding things, such as medicines, that are known to cause your seizures. Medicines to prevent seizures. These are called antiepileptics. A device to prevent or control seizures. Eating foods that are low in carbohydrates and high in fat (ketogenic diet). Surgery. This is sometimes needed if you keep having seizures. Follow these instructions at home: Medicines Take your medicines only as told by your health care provider. Avoid anything that may keep your medicine from working, such as alcohol. Activity Follow your provider's advice about driving, swimming, and doing other things that would be dangerous if you had a seizure. Wait until your provider says it's safe for you to do these things. If you live in the [place] ask your local department of motor vehicles [facility]) when you can drive. Get  enough rest and sleep. Not getting enough sleep can make seizures more likely to happen. Teaching others  Teach friends and family what to do if you have a seizure. Tell them to: Help you get down to the ground safely. Protect your head  and body. Loosen any clothing around your neck. Turn you on your side. This helps keep your airway clear if you vomit. Know whether or not you need emergency care. Stay with you until you are better. Also, tell them what not to do if you have a seizure. Tell them: They should not hold you down. They should not put anything in your mouth. General instructions Avoid anything that has caused you to have seizures. Keep a seizure diary. Write down: What you remember about each seizure. What you think might have caused each seizure. Keep all follow-up visits. Your provider may need to monitor your progress. Contact a health care provider if: You have another seizure or seizures. Call each time you have a seizure. You have a change in how often or when you have seizures. You keep having seizures with treatment. You have symptoms of being sick or having an infection. You are not able to take your medicine. Get help right away if: You have or someone has seen you have: A seizure that lasts longer than 5 minutes. Many seizures in a row and you don't feel better between seizures. A seizure that makes it harder to breathe. A seizure that leaves you unable to speak or use a part of your body. You didn't wake up right away after a seizure. You injure yourself during a seizure. You have confusion or pain right after a seizure. These symptoms may be an emergency. Call 911 right away. Do not wait to see if the symptoms will go away. Do not drive yourself to the hospital. This information is not intended to replace advice given to you by your health care provider. Make sure you discuss any questions you have with your health care provider. Document Revised: 12/15/2022 Document Reviewed: 04/27/2022 Elsevier Patient Education  2024 [employer].

## 2023-10-30 ENCOUNTER — Telehealth: Payer: Self-pay

## 2023-10-30 ENCOUNTER — Other Ambulatory Visit: Payer: Self-pay | Admitting: Family Medicine

## 2023-10-30 DIAGNOSIS — E66811 Obesity, class 1: Secondary | ICD-10-CM

## 2023-10-30 DIAGNOSIS — E88819 Insulin resistance, unspecified: Secondary | ICD-10-CM

## 2023-10-30 NOTE — Telephone Encounter (Signed)
 Refill sent.

## 2023-10-30 NOTE — Telephone Encounter (Signed)
 Copied from CRM #[id number]. Topic: Clinical - Medication Question >> Oct 30, 2023  1:17 PM Chasity T wrote: Reason for CRM: Patient is calling in to make sure Dr Antonio received request from pharmacy for the medication tirzepatide  (ZEPBOUND ) 7.5 MG/0.5ML Pen.

## 2023-11-07 ENCOUNTER — Ambulatory Visit: Admitting: Family Medicine

## 2023-11-08 ENCOUNTER — Ambulatory Visit (INDEPENDENT_AMBULATORY_CARE_PROVIDER_SITE_OTHER): Admitting: Neurology

## 2023-11-08 ENCOUNTER — Encounter: Payer: Self-pay | Admitting: Neurology

## 2023-11-08 VITALS — BP 115/81 | HR 67 | Resp 18 | Wt 191.0 lb

## 2023-11-08 DIAGNOSIS — G40009 Localization-related (focal) (partial) idiopathic epilepsy and epileptic syndromes with seizures of localized onset, not intractable, without status epilepticus: Secondary | ICD-10-CM | POA: Diagnosis not present

## 2023-11-08 DIAGNOSIS — G40909 Epilepsy, unspecified, not intractable, without status epilepticus: Secondary | ICD-10-CM | POA: Insufficient documentation

## 2023-11-08 MED ORDER — LEVETIRACETAM 500 MG PO TABS
500.0000 mg | ORAL_TABLET | Freq: Two times a day (BID) | ORAL | 3 refills | Status: DC
Start: 2023-11-08 — End: 2024-02-12

## 2023-11-08 NOTE — Progress Notes (Signed)
 NEUROLOGY CONSULTATION NOTE  Veronica Stanton MRN: [id number] DOB: 11/28/1980  Referring provider: Dr. April Palumbo (ER) Primary care provider: Dr. Jamee Antonio Meth  Reason for consult:  seizure  Dear Dr Nettie:  Thank you for your kind referral of Veronica Stanton for consultation of the above symptoms. Although her history is well known to you, please allow me to reiterate it for the purpose of our medical record. She is alone in the office today. Records and images were personally reviewed where available.   HISTORY OF PRESENT ILLNESS: This is a very pleasant 43 year old right-handed woman with a history of insulin  resistance presenting for seizure recurrence. 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 In 2016, she was having episodes waking her up from night when she would see writing on the wall, described as black cursive writing. On 07/19/14, she woke up and saw the black cursive writing on the wall, then has no recollection of events until EMS arrived. Her daughter was in the room and heard her make funny noises, open her eyes, get up to go to the bathroom then fall to the floor with urinary incontinence. She also reported episodes of a weird feeling like being scared and dreaming something. MRI brain with and without contrast in 2016 was normal. Her 24-hour EEG in 07/2014 showed occasional right anterior temporal sharp waves exclusively in sleep. She was started on Topiramate  in 2016 with no further episodes of loss of consciousness. She had one episode of seeing the cursive writing in 10/2017. She was having word-finding and cognitive difficulties on the Topiramate . She was lost to follow-up after 2021 and self-discontinued the Topiramate  since she was doing well, cognitive difficulties improved. She did well for 4 years until May 2025. She had a witnessed convulsion on 08/26/23. She recalls that for the month prior, there were 3 instances she woke up  feeling like she bit her tongue on one side. She was also having episodes where she would wake up feeling her mouth moving. The week prior to the convulsion, she had an episode where she fell asleep while a mechanic was at their home, they had been calling her and she was not answering, then when she woke up she did not recall the mechanic was there earlier. On 08/26/23 at 2am, her daughter heard a grunting noise and saw her convulsing. She woke up to EMS around her feeling fine, no tongue bite, incontinence, or focal weakness. She was brought to the ER were CBC, BMP were unremarkable. Head CT no acute changes. She was discharged home on Levetiracetam  500mg  BID which she is tolerating well with no cognitive difficulties. It makes her a little drowsy in the day and she may take a nap. She denies any further convulsions since 08/26/23. No further episodes waking up with chewing movements or tongue bite.   She denies any staring/unresponsive episodes, gaps in time, visual/olfactory/gustatory hallucinations, deja vu, rising epigastric sensation, focal numbness/tingling/weakness, myoclonic jerks. She denies any headaches, dizziness, diplopia, dysarthria/dysphagia, neck/back pain, bowel/bladder dysfunction. Memory is good. No new medications or infections, she has been on Zepbound  since January. She has been sleeping well and denies any increased stress or significant alcohol use. Her period is irregular, she was not on her period with the seizure in May. She lives with her 61 year old daughter. She works remotely. No pregnancy plans.   Epilepsy Risk Factors: She had a normal birth and early development.  There is no history of febrile convulsions, CNS infections such as meningitis/encephalitis, significant traumatic brain injury, neurosurgical procedures, or family history of seizures.  Diagnostic Data:  Routine EEG 07/2014 normal 24-hour EEG in 07/2014 was abnormal with occasional right anterior temporal sharp waves  seen exclusively in sleep.   MRI brain with and without contrast in 07/2014 was normal, hippocampi symmetric with no abnormal signal or enhancement seen.    PAST MEDICAL HISTORY: Past Medical History:  Diagnosis Date   No diagnosis    Palpitations     PAST SURGICAL HISTORY: Past Surgical History:  Procedure Laterality Date   negative      MEDICATIONS: Outpatient Encounter Medications as of 11/08/2023  Medication Sig   Vitamin D , Ergocalciferol , (DRISDOL ) 1.25 MG (50000 UNIT) CAPS capsule Take 1 capsule (50,000 Units total) by mouth every 7 (seven) days.               levETIRAcetam  (KEPPRA ) 500 MG tablet Take 1 tablet (500 mg total) by mouth 2 (two) times daily.   tirzepatide  (ZEPBOUND ) 7.5 MG/0.5ML Pen INJECT 7.5 MG SUBCUTANEOUSLY WEEKLY (Patient not taking: Reported on 11/08/2023)   No facility-administered encounter medications on file as of 11/08/2023.    ALLERGIES: No Known Allergies  FAMILY HISTORY: Family History  Problem Relation Age of Onset   Congestive Heart Failure Father    Kidney failure Father    Diabetes Mellitus II Paternal Aunt    AAA (abdominal aortic aneurysm) Paternal Aunt    Stroke Neg Hx    Heart disease Neg Hx    Mental illness Neg Hx     SOCIAL HISTORY: Social History   Socioeconomic History   Marital status: Single    Spouse name: Not on file   Number of children: Not on file   Years of education: Not on file   Highest education level: Not on file  Occupational History   Not on file  Tobacco Use   Smoking status: Never   Smokeless tobacco: Never  Vaping Use   Vaping status: Never Used  Substance and Sexual Activity   Alcohol use: No   Drug use: No   Sexual activity: Not on file  Other Topics Concern   Not on file  Social History Narrative   Right handed   Currently employed   Lives alone   Two story home   Social Drivers of Health   Financial Resource Strain: Not on file  Food Insecurity: Not on file  Transportation  Needs: Not on file  Physical Activity: Not on file  Stress: Not on file  Social Connections: Unknown (11/24/2022)   Received from [facility]   Social Network    Social Network: Not on file  Intimate Partner Violence: Unknown (11/24/2022)   Received from [facility]   HITS    Physically Hurt: Not on file    Insult or Talk Down To: Not on file    Threaten Physical Harm: Not on file    Scream or Curse: Not on file     PHYSICAL EXAM: Vitals:   11/08/23 0901  BP: 115/81  Pulse: 67  Resp: 18  SpO2: 100%   General: No acute distress Head:  Normocephalic/atraumatic Skin/Extremities: No rash, no edema Neurological Exam: Mental status: alert and oriented to person, place, and time, no dysarthria or aphasia, Fund of knowledge is appropriate.  Recent and remote memory are intact, 3/3 delayed recall.  Attention and concentration are normal, 5/5 WORLD backwards.  Cranial nerves: CN I: not  tested CN II: pupils equal, round, visual fields intact CN III, IV, VI:  full range of motion, no nystagmus, no ptosis CN V: facial sensation intact CN VII: upper and lower face symmetric CN VIII: hearing intact to conversation Bulk & Tone: normal, no fasciculations. Motor: 5/5 throughout with no pronator drift. Sensation: intact to light touch, cold, pin, vibration sense.  No extinction to double simultaneous stimulation.  Romberg test negative Deep Tendon Reflexes: +1 throughout Cerebellar: no incoordination on finger to nose testing Gait: narrow-based and steady, able to tandem walk adequately. Tremor: none   IMPRESSION: This is a very pleasant 43 year old right-handed woman with a history of insulin  resistance presenting for seizure recurrence. Prior 24-hour EEG in 2016 showed right anterior temporal epileptiform discharges in sleep. She was lost to follow-up off seizure medication for at least 4 years with no episodes of loss of consciousness for 9 years until a witnessed nocturnal  convulsion on 08/26/23. For the month prior to this, she was having episodes concerning for nocturnal oral automatisms, waking up with tongue bite. We discussed the diagnosis of temporal lobe epilepsy, including management and prognosis. MRI brain with and without contrast and 1-hour EEG will be ordered. She is doing well on Levetiracetam  500mg  BID, side effects discussed. [place] driving laws were discussed with the patient, and she knows to stop driving after a seizure, until 6 months seizure-free. No pregnancy plans. Follow-up in 3 months, call for any changes.   Thank you for allowing me to participate in the care of this patient. Please do not hesitate to call for any questions or concerns.   Darice Shivers, M.D.  CC: Dr. Cyndee

## 2023-11-08 NOTE — Patient Instructions (Addendum)
 Good to see you doing well.  Schedule MRI brain with and without contrast [phone] At [facility]  Schedule 1-hour EEG  3. Continue Keppra  (Levetiracetam ) 500mg  twice a day  4. Follow-up in 3 months, call for any changes.    Seizure Precautions: 1. If medication has been prescribed for you to prevent seizures, take it exactly as directed.  Do not stop taking the medicine without talking to your doctor first, even if you have not had a seizure in a long time.   2. Avoid activities in which a seizure would cause danger to yourself or to others.  Don't operate dangerous machinery, swim alone, or climb in high or dangerous places, such as on ladders, roofs, or girders.  Do not drive unless your doctor says you may.  3. If you have any warning that you may have a seizure, lay down in a safe place where you can't hurt yourself.    4.  No driving for 6 months from last seizure, as per [place]  state law.   Please refer to the following link on the Epile[employer] of [employer]'s website for more information: http://www.epilepsyfoundation.org/answerplace/Social/driving/drivingu.cfm   5.  Maintain good sleep hygiene. Avoid alcohol.  6.  Notify your neurology if you are planning pregnancy or if you become pregnant.  7.  Contact your doctor if you have any problems that may be related to the medicine you are taking.  8.  Call 911 and bring the patient back to the ED if:        A.  The seizure lasts longer than 5 minutes.       B.  The patient doesn't awaken shortly after the seizure  C.  The patient has new problems such as difficulty seeing, speaking or moving  D.  The patient was injured during the seizure  E.  The patient has a temperature over 102 F (39C)  F.  The patient vomited and now is having trouble breathing

## 2023-11-21 ENCOUNTER — Ambulatory Visit: Admitting: Neurology

## 2023-11-29 ENCOUNTER — Other Ambulatory Visit

## 2023-11-29 ENCOUNTER — Encounter: Payer: Self-pay | Admitting: Neurology

## 2023-12-04 ENCOUNTER — Encounter: Payer: Self-pay | Admitting: Neurology

## 2023-12-06 ENCOUNTER — Ambulatory Visit
Admission: RE | Admit: 2023-12-06 | Discharge: 2023-12-06 | Disposition: A | Discharge status: Home / Self Care | Source: Ambulatory Visit | Attending: Neurology | Admitting: Neurology

## 2023-12-06 DIAGNOSIS — G4089 Other seizures: Secondary | ICD-10-CM | POA: Diagnosis not present

## 2023-12-06 MED ORDER — GADOPICLENOL 0.5 MMOL/ML IV SOLN
9.0000 mL | Freq: Once | INTRAVENOUS | Status: AC | PRN
  Administered 2023-12-06: 9 mL via INTRAVENOUS

## 2023-12-07 ENCOUNTER — Ambulatory Visit: Admitting: Family Medicine

## 2023-12-11 ENCOUNTER — Encounter: Payer: Self-pay | Admitting: Family Medicine

## 2023-12-11 ENCOUNTER — Ambulatory Visit (INDEPENDENT_AMBULATORY_CARE_PROVIDER_SITE_OTHER): Admitting: Family Medicine

## 2023-12-11 VITALS — BP 112/78 | HR 54 | Temp 98.2°F | Resp 16 | Ht 65.0 in | Wt 191.0 lb

## 2023-12-11 DIAGNOSIS — E88819 Insulin resistance, unspecified: Secondary | ICD-10-CM | POA: Diagnosis not present

## 2023-12-11 DIAGNOSIS — E66811 Obesity, class 1: Secondary | ICD-10-CM

## 2023-12-11 DIAGNOSIS — G40909 Epilepsy, unspecified, not intractable, without status epilepticus: Secondary | ICD-10-CM | POA: Diagnosis not present

## 2023-12-11 MED ORDER — TIRZEPATIDE-WEIGHT MANAGEMENT 5 MG/0.5ML ~~LOC~~ SOLN: 5.0000 mg | SUBCUTANEOUS | 0 refills | Status: DC

## 2023-12-11 NOTE — Progress Notes (Unsigned)
 Subjective:    Patient ID: Veronica Stanton, female    DOB: 1981-03-24, 43 y.o.   MRN: [id number]  No chief complaint on file.   HPI Patient is in today for ***  Past Medical History:  Diagnosis Date   No diagnosis    Palpitations     Past Surgical History:  Procedure Laterality Date   negative      Family History  Problem Relation Age of Onset   Congestive Heart Failure Father    Kidney failure Father    Diabetes Mellitus II Paternal Aunt    AAA (abdominal aortic aneurysm) Paternal Aunt    Stroke Neg Hx    Heart disease Neg Hx    Mental illness Neg Hx     Social History   Socioeconomic History   Marital status: Single    Spouse name: Not on file   Number of children: Not on file   Years of education: Not on file   Highest education level: Not on file  Occupational History   Not on file  Tobacco Use   Smoking status: Never   Smokeless tobacco: Never  Vaping Use   Vaping status: Never Used  Substance and Sexual Activity   Alcohol use: No   Drug use: No   Sexual activity: Not on file  Other Topics Concern   Not on file  Social History Narrative   Right handed   Currently employed   Lives alone   Two story home   Social Drivers of Health   Financial Resource Strain: Not on file  Food Insecurity: Not on file  Transportation Needs: Not on file  Physical Activity: Not on file  Stress: Not on file  Social Connections: Unknown (11/24/2022)   Received from [facility]   Social Network    Social Network: Not on file  Intimate Partner Violence: Unknown (11/24/2022)   Received from [facility]   HITS    Physically Hurt: Not on file    Insult or Talk Down To: Not on file    Threaten Physical Harm: Not on file    Scream or Curse: Not on file    Outpatient Medications Prior to Visit  Medication Sig Dispense Refill   levETIRAcetam  (KEPPRA ) 500 MG tablet Take 1 tablet (500 mg total) by mouth 2 (two) times daily. 180 tablet 3   Vitamin D ,  Ergocalciferol , (DRISDOL ) 1.25 MG (50000 UNIT) CAPS capsule Take 1 capsule (50,000 Units total) by mouth every 7 (seven) days. 12 capsule 2   tirzepatide  (ZEPBOUND ) 7.5 MG/0.5ML Pen INJECT 7.5 MG SUBCUTANEOUSLY WEEKLY (Patient not taking: Reported on 12/11/2023) 7.5 mL 1   No facility-administered medications prior to visit.    No Known Allergies  ROS     Objective:    Physical Exam  There were no vitals taken for this visit. Wt Readings from Last 3 Encounters:  11/08/23 191 lb (86.6 kg)  09/07/23 184 lb (83.5 kg)  08/07/23 186 lb 9.6 oz (84.6 kg)    Diabetic Foot Exam - Simple   No data filed    Lab Results  Component Value Date   WBC 8.4 08/26/2023   HGB 12.6 08/26/2023   HCT 38.1 08/26/2023   PLT 227 08/26/2023   GLUCOSE 102 (H) 08/26/2023   CHOL 192 08/07/2023   TRIG 35.0 08/07/2023   HDL 49.60 08/07/2023   LDLCALC 136 (H) 08/07/2023   ALT 11 08/07/2023   AST 12 08/07/2023   NA 136 08/26/2023  K 3.4 (L) 08/26/2023   CL 103 08/26/2023   CREATININE 0.77 08/26/2023   BUN 11 08/26/2023   CO2 24 08/26/2023   TSH 1.05 08/07/2023   HGBA1C 5.3 08/07/2023    Lab Results  Component Value Date   TSH 1.05 08/07/2023   Lab Results  Component Value Date   WBC 8.4 08/26/2023   HGB 12.6 08/26/2023   HCT 38.1 08/26/2023   MCV 94.8 08/26/2023   PLT 227 08/26/2023   Lab Results  Component Value Date   NA 136 08/26/2023   K 3.4 (L) 08/26/2023   CO2 24 08/26/2023   GLUCOSE 102 (H) 08/26/2023   BUN 11 08/26/2023   CREATININE 0.77 08/26/2023   BILITOT 0.3 08/07/2023   ALKPHOS 36 (L) 08/07/2023   AST 12 08/07/2023   ALT 11 08/07/2023   PROT 7.3 08/07/2023   ALBUMIN 4.0 08/07/2023   CALCIUM 9.1 08/26/2023   ANIONGAP 9 08/26/2023   GFR 103.02 08/07/2023   Lab Results  Component Value Date   CHOL 192 08/07/2023   Lab Results  Component Value Date   HDL 49.60 08/07/2023   Lab Results  Component Value Date   LDLCALC 136 (H) 08/07/2023   Lab Results   Component Value Date   TRIG 35.0 08/07/2023   Lab Results  Component Value Date   CHOLHDL 4 08/07/2023   Lab Results  Component Value Date   HGBA1C 5.3 08/07/2023       Assessment & Plan:  There are no diagnoses linked to this encounter.  Rhylin Venters R Lowne Chase, DO

## 2023-12-12 ENCOUNTER — Ambulatory Visit

## 2023-12-12 DIAGNOSIS — G40009 Localization-related (focal) (partial) idiopathic epilepsy and epileptic syndromes with seizures of localized onset, not intractable, without status epilepticus: Secondary | ICD-10-CM

## 2023-12-12 LAB — CBC WITH DIFFERENTIAL/PLATELET
Basophils Absolute: 0 K/uL (ref 0.0–0.1)
Basophils Relative: 0.7 % (ref 0.0–3.0)
Eosinophils Absolute: 0.1 K/uL (ref 0.0–0.7)
Eosinophils Relative: 1.3 % (ref 0.0–5.0)
HCT: 37.1 % (ref 36.0–46.0)
Hemoglobin: 12.3 g/dL (ref 12.0–15.0)
Lymphocytes Relative: 25.4 % (ref 12.0–46.0)
Lymphs Abs: 1.6 K/uL (ref 0.7–4.0)
MCHC: 33.2 g/dL (ref 30.0–36.0)
MCV: 90.9 fl (ref 78.0–100.0)
Monocytes Absolute: 0.6 K/uL (ref 0.1–1.0)
Monocytes Relative: 10 % (ref 3.0–12.0)
Neutro Abs: 4 K/uL (ref 1.4–7.7)
Neutrophils Relative %: 62.6 % (ref 43.0–77.0)
Platelets: 175 K/uL (ref 150.0–400.0)
RBC: 4.09 Mil/uL (ref 3.87–5.11)
RDW: 14.2 % (ref 11.5–15.5)
WBC: 6.4 K/uL (ref 4.0–10.5)

## 2023-12-12 LAB — COMPREHENSIVE METABOLIC PANEL WITH GFR
ALT: 12 U/L (ref 0–35)
AST: 12 U/L (ref 0–37)
Albumin: 3.9 g/dL (ref 3.5–5.2)
Alkaline Phosphatase: 36 U/L — ABNORMAL LOW (ref 39–117)
BUN: 9 mg/dL (ref 6–23)
CO2: 29 meq/L (ref 19–32)
Calcium: 9.4 mg/dL (ref 8.4–10.5)
Chloride: 100 meq/L (ref 96–112)
Creatinine, Ser: 0.79 mg/dL (ref 0.40–1.20)
GFR: 90.41 mL/min (ref >60.00)
Glucose, Bld: 75 mg/dL (ref 70–99)
Potassium: 4.4 meq/L (ref 3.5–5.1)
Sodium: 138 meq/L (ref 135–145)
Total Bilirubin: 0.3 mg/dL (ref 0.2–1.2)
Total Protein: 7.1 g/dL (ref 6.0–8.3)

## 2023-12-12 LAB — LIPID PANEL
Cholesterol: 193 mg/dL (ref 0–200)
HDL: 52.2 mg/dL (ref >39.00)
LDL Cholesterol: 134 mg/dL — ABNORMAL HIGH (ref 0–99)
NonHDL: 140.43
Total CHOL/HDL Ratio: 4
Triglycerides: 34 mg/dL (ref 0.0–149.0)
VLDL: 6.8 mg/dL (ref 0.0–40.0)

## 2023-12-12 LAB — HEMOGLOBIN A1C: Hgb A1c MFr Bld: 5.8 % (ref 4.6–6.5)

## 2023-12-12 LAB — TSH: TSH: 1.21 u[IU]/mL (ref 0.35–5.50)

## 2023-12-12 LAB — INSULIN, RANDOM: Insulin: 10.1 u[IU]/mL

## 2023-12-15 ENCOUNTER — Ambulatory Visit: Payer: Self-pay | Admitting: Family Medicine

## 2023-12-21 ENCOUNTER — Ambulatory Visit: Payer: Self-pay | Admitting: Neurology

## 2024-02-12 ENCOUNTER — Encounter: Payer: Self-pay | Admitting: Neurology

## 2024-02-12 ENCOUNTER — Ambulatory Visit: Admitting: Neurology

## 2024-02-12 DIAGNOSIS — G40009 Localization-related (focal) (partial) idiopathic epilepsy and epileptic syndromes with seizures of localized onset, not intractable, without status epilepticus: Secondary | ICD-10-CM | POA: Diagnosis not present

## 2024-02-12 MED ORDER — LEVETIRACETAM 500 MG PO TABS: 500.0000 mg | ORAL_TABLET | Freq: Two times a day (BID) | ORAL | 3 refills | Status: AC

## 2024-02-12 NOTE — Progress Notes (Signed)
 NEUROLOGY FOLLOW UP OFFICE NOTE  Veronica Stanton [id number] October 09, 1980  HISTORY OF PRESENT ILLNESS: I had the pleasure of seeing Veronica Stanton in follow-up in the neurology clinic on 02/12/2024.  The patient was last seen 3 months ago for seizure recurrence. She is alone in the office today. Records and images were personally reviewed where available.  I personally reviewed brain MRI with and without contrast done 11/2023 which was normal, hippocampi symmetric with no abnormal signal or enhancement seen. She was unable to do the EEG. She reports doing well with no convulsions since 08/26/23. She denies any further episodes of waking up with tongue bite or mouth movements. She denies any staring/unresponsive episodes, gaps in time, olfactory/gustatory hallucinations, focal numbness/tingling/weakness, myoclonic jerks. No headaches, dizziness, vision changes, no falls. No side effects on Levetiracetam  500mg  BID, she can think straight. Mood is great. She gets around 6 hours of sleep.    History on Initial Assessment 11/08/2023: This is a very pleasant 43 year old right-handed woman with a history of insulin  resistance presenting for seizure recurrence. 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 In 2016, she was having episodes waking her up from night when she would see writing on the wall, described as black cursive writing. On 07/19/14, she woke up and saw the black cursive writing on the wall, then has no recollection of events until EMS arrived. Her daughter was in the room and heard her make funny noises, open her eyes, get up to go to the bathroom then fall to the floor with urinary incontinence. She also reported episodes of a weird feeling like being scared and dreaming something. MRI brain with and without contrast in 2016 was normal. Her 24-hour EEG in 07/2014 showed occasional right anterior temporal sharp waves exclusively in sleep. She was started on Topiramate   in 2016 with no further episodes of loss of consciousness. She had one episode of seeing the cursive writing in 10/2017. She was having word-finding and cognitive difficulties on the Topiramate . She was lost to follow-up after 2021 and self-discontinued the Topiramate  since she was doing well, cognitive difficulties improved. She did well for 4 years until May 2025. She had a witnessed convulsion on 08/26/23. She recalls that for the month prior, there were 3 instances she woke up feeling like she bit her tongue on one side. She was also having episodes where she would wake up feeling her mouth moving. The week prior to the convulsion, she had an episode where she fell asleep while a mechanic was at their home, they had been calling her and she was not answering, then when she woke up she did not recall the mechanic was there earlier. On 08/26/23 at 2am, her daughter heard a grunting noise and saw her convulsing. She woke up to EMS around her feeling fine, no tongue bite, incontinence, or focal weakness. She was brought to the ER were CBC, BMP were unremarkable. Head CT no acute changes. She was discharged home on Levetiracetam  500mg  BID which she is tolerating well with no cognitive difficulties. It makes her a little drowsy in the day and she may take a nap. She denies any further convulsions since 08/26/23. No further episodes waking up with chewing movements or tongue bite.   She denies any staring/unresponsive episodes, gaps in time, visual/olfactory/gustatory hallucinations, deja vu, rising epigastric sensation, focal numbness/tingling/weakness, myoclonic jerks. She denies any headaches, dizziness, diplopia, dysarthria/dysphagia, neck/back pain, bowel/bladder dysfunction. Memory is good.  No new medications or infections, she has been on Zepbound  since January. She has been sleeping well and denies any increased stress or significant alcohol use. Her period is irregular, she was not on her period with the seizure  in May. She lives with her 56 year old daughter. She works remotely. No pregnancy plans.   Epilepsy Risk Factors: She had a normal birth and early development.  There is no history of febrile convulsions, CNS infections such as meningitis/encephalitis, significant traumatic brain injury, neurosurgical procedures, or family history of seizures.  Diagnostic Data:  Routine EEG 07/2014 normal 24-hour EEG in 07/2014 was abnormal with occasional right anterior temporal sharp waves seen exclusively in sleep.   MRI brain with and without contrast in 07/2014 was normal, hippocampi symmetric with no abnormal signal or enhancement seen.   PAST MEDICAL HISTORY: Past Medical History:  Diagnosis Date   No diagnosis    Palpitations     MEDICATIONS: Current Outpatient Medications on File Prior to Visit  Medication Sig Dispense Refill   tirzepatide  5 MG/0.5ML injection vial Inject 5 mg into the skin once a week. 6 mL 0   Vitamin D , Ergocalciferol , (DRISDOL ) 1.25 MG (50000 UNIT) CAPS capsule Take 1 capsule (50,000 Units total) by mouth every 7 (seven) days. 12 capsule 2   levETIRAcetam  (KEPPRA ) 500 MG tablet Take 1 tablet (500 mg total) by mouth 2 (two) times daily. 180 tablet 3   No current facility-administered medications on file prior to visit.    ALLERGIES: No Known Allergies  FAMILY HISTORY: Family History  Problem Relation Age of Onset   Congestive Heart Failure Father    Kidney failure Father    Diabetes Mellitus II Paternal Aunt    AAA (abdominal aortic aneurysm) Paternal Aunt    Stroke Neg Hx    Heart disease Neg Hx    Mental illness Neg Hx     SOCIAL HISTORY: Social History   Socioeconomic History   Marital status: Single    Spouse name: Not on file   Number of children: Not on file   Years of education: Not on file   Highest education level: Not on file  Occupational History   Not on file  Tobacco Use   Smoking status: Never   Smokeless tobacco: Never  Vaping Use    Vaping status: Never Used  Substance and Sexual Activity   Alcohol use: No   Drug use: No   Sexual activity: Not on file  Other Topics Concern   Not on file  Social History Narrative   Right handed   Currently employed   Lives alone   Two story home   Social Drivers of Health   Financial Resource Strain: Not on file  Food Insecurity: Not on file  Transportation Needs: Not on file  Physical Activity: Not on file  Stress: Not on file  Social Connections: Unknown (11/24/2022)   Received from [facility]   Social Network    Social Network: Not on file  Intimate Partner Violence: Unknown (11/24/2022)   Received from [facility]   HITS    Physically Hurt: Not on file    Insult or Talk Down To: Not on file    Threaten Physical Harm: Not on file    Scream or Curse: Not on file     PHYSICAL EXAM: Vitals:   02/12/24 1334  BP: 128/89  Pulse: 73   General: No acute distress Head:  Normocephalic/atraumatic Skin/Extremities: No rash, no edema Neurological Exam: alert  and awake. No aphasia or dysarthria. Fund of knowledge is appropriate.   Attention and concentration are normal.   Cranial nerves: Pupils equal, round. Extraocular movements intact with no nystagmus. Visual fields full.  No facial asymmetry.  Motor: Bulk and tone normal, muscle strength 5/5 throughout with no pronator drift.   Finger to nose testing intact.  Gait narrow-based and steady, able to tandem walk adequately.  Romberg negative.   IMPRESSION: This is a very pleasant 43 yo RH woman with a history of insulin  resistance and right temporal lobe epilepsy, who had been seizure-free 9 years off medication for at least 4 years until a nocturnal convulsion on 08/26/23. MRI brain normal. Her previous EEG in 2016 showed right temporal epileptiform discharges. There were issues getting the EEG done, we agreed that since she has been doing well seizure-free since 08/26/23, we can hold off on EEG for now. Continue  Levetiracetam  500mg  BID. If seizures recur, we will plan for repeat EEG at that point. She is aware of  driving laws to stop driving after a seizure until 6 months seizure-free. Follow-up in 3-4 months, call for any changes.   Thank you for allowing me to participate in her care.  Please do not hesitate to call for any questions or concerns.   Darice Shivers, M.D.   CC: Dr. Cyndee

## 2024-02-12 NOTE — Patient Instructions (Signed)
 It's always good to see you.  Continue Levetiracetam  (Keppra ) 500mg  twice a day. Follow-up in 3 months, call for any changes.    Seizure Precautions: 1. If medication has been prescribed for you to prevent seizures, take it exactly as directed.  Do not stop taking the medicine without talking to your doctor first, even if you have not had a seizure in a long time.   2. Avoid activities in which a seizure would cause danger to yourself or to others.  Don't operate dangerous machinery, swim alone, or climb in high or dangerous places, such as on ladders, roofs, or girders.  Do not drive unless your doctor says you may.  3. If you have any warning that you may have a seizure, lay down in a safe place where you can't hurt yourself.    4.  No driving for 6 months from last seizure, as per [place]  state law.   Please refer to the following link on the [employer]'s website for more information: http://www.epilepsyfoundation.org/answerplace/Social/driving/drivingu.cfm   5.  Maintain good sleep hygiene. Avoid alcohol  6.  Notify your neurology if you are planning pregnancy or if you become pregnant.  7.  Contact your doctor if you have any problems that may be related to the medicine you are taking.  8.  Call 911 and bring the patient back to the ED if:        A.  The seizure lasts longer than 5 minutes.       B.  The patient doesn't awaken shortly after the seizure  C.  The patient has new problems such as difficulty seeing, speaking or moving  D.  The patient was injured during the seizure  E.  The patient has a temperature over 102 F (39C)  F.  The patient vomited and now is having trouble breathing

## 2024-03-07 ENCOUNTER — Other Ambulatory Visit (HOSPITAL_BASED_OUTPATIENT_CLINIC_OR_DEPARTMENT_OTHER): Payer: Self-pay

## 2024-03-07 ENCOUNTER — Ambulatory Visit: Admitting: Family Medicine

## 2024-03-07 ENCOUNTER — Encounter: Payer: Self-pay | Admitting: Family Medicine

## 2024-03-07 VITALS — BP 114/88 | HR 101 | Temp 98.5°F | Resp 18 | Ht 65.0 in | Wt 193.0 lb

## 2024-03-07 DIAGNOSIS — E66811 Obesity, class 1: Secondary | ICD-10-CM | POA: Diagnosis not present

## 2024-03-07 DIAGNOSIS — E88819 Insulin resistance, unspecified: Secondary | ICD-10-CM | POA: Diagnosis not present

## 2024-03-07 DIAGNOSIS — G40909 Epilepsy, unspecified, not intractable, without status epilepticus: Secondary | ICD-10-CM

## 2024-03-07 MED ORDER — TIRZEPATIDE-WEIGHT MANAGEMENT 5 MG/0.5ML ~~LOC~~ SOAJ
5.0000 mg | SUBCUTANEOUS | 0 refills | Status: AC
  Filled 2024-03-07: qty 2, 28d supply, fill #0
  Filled 2024-03-07: qty 6, 84d supply, fill #0

## 2024-03-07 NOTE — Progress Notes (Signed)
 Subjective:    Patient ID: Veronica Stanton, female    DOB: 12-Jun-1980, 43 y.o.   MRN: [id number]  Chief Complaint  Patient presents with   Weight Check    HPI Patient is in today for weight check.   Past Medical History:  Diagnosis Date   No diagnosis    Palpitations     Past Surgical History:  Procedure Laterality Date   negative      Family History  Problem Relation Age of Onset   Congestive Heart Failure Father    Kidney failure Father    Diabetes Mellitus II Paternal Aunt    AAA (abdominal aortic aneurysm) Paternal Aunt    Stroke Neg Hx    Heart disease Neg Hx    Mental illness Neg Hx     Social History   Socioeconomic History   Marital status: Single    Spouse name: Not on file   Number of children: Not on file   Years of education: Not on file   Highest education level: Not on file  Occupational History   Not on file  Tobacco Use   Smoking status: Never   Smokeless tobacco: Never  Vaping Use   Vaping status: Never Used  Substance and Sexual Activity   Alcohol use: No   Drug use: No   Sexual activity: Not on file  Other Topics Concern   Not on file  Social History Narrative   Right handed   Currently employed   Lives alone   Two story home   Social Drivers of Health   Tobacco Use: Low Risk (03/07/2024)   Patient History    Smoking Tobacco Use: Never    Smokeless Tobacco Use: Never    Passive Exposure: Not on file  Financial Resource Strain: Not on file  Food Insecurity: Not on file  Transportation Needs: Not on file  Physical Activity: Not on file  Stress: Not on file  Social Connections: Unknown (11/24/2022)   Received from [facility]   Social Network    Social Network: Not on file  Intimate Partner Violence: Unknown (11/24/2022)   Received from [facility]   HITS    Physically Hurt: Not on file    Insult or Talk Down To: Not on file    Threaten Physical Harm: Not on file    Scream or Curse: Not on file  Depression  (PHQ2-9): Low Risk (12/11/2023)   Depression (PHQ2-9)    PHQ-2 Score: 0  Alcohol Screen: Not on file  Housing: Not on file  Utilities: Not on file  Health Literacy: Not on file    Outpatient Medications Prior to Visit  Medication Sig Dispense Refill   levETIRAcetam  (KEPPRA ) 500 MG tablet Take 1 tablet (500 mg total) by mouth 2 (two) times daily. 180 tablet 3   Vitamin D , Ergocalciferol , (DRISDOL ) 1.25 MG (50000 UNIT) CAPS capsule Take 1 capsule (50,000 Units total) by mouth every 7 (seven) days. 12 capsule 2   tirzepatide  5 MG/0.5ML injection vial Inject 5 mg into the skin once a week. 6 mL 0   No facility-administered medications prior to visit.    Allergies[1]  Review of Systems  Constitutional:  Negative for fever and malaise/fatigue.  HENT:  Negative for congestion.   Eyes:  Negative for blurred vision.  Respiratory:  Negative for shortness of breath.   Cardiovascular:  Negative for chest pain, palpitations and leg swelling.  Gastrointestinal:  Negative for abdominal pain, blood in stool and nausea.  Genitourinary:  Negative for dysuria and frequency.  Musculoskeletal:  Negative for falls.  Skin:  Negative for rash.  Neurological:  Negative for dizziness, loss of consciousness and headaches.  Endo/Heme/Allergies:  Negative for environmental allergies.  Psychiatric/Behavioral:  Negative for depression. The patient is not nervous/anxious.        Objective:    Physical Exam Vitals and nursing note reviewed.  Constitutional:      General: She is not in acute distress.    Appearance: Normal appearance. She is well-developed.  HENT:     Head: Normocephalic and atraumatic.  Eyes:     General: No scleral icterus.       Right eye: No discharge.        Left eye: No discharge.  Cardiovascular:     Rate and Rhythm: Normal rate and regular rhythm.     Heart sounds: No murmur heard. Pulmonary:     Effort: Pulmonary effort is normal. No respiratory distress.     Breath  sounds: Normal breath sounds.  Musculoskeletal:        General: Normal range of motion.     Cervical back: Normal range of motion and neck supple.     Right lower leg: No edema.     Left lower leg: No edema.  Skin:    General: Skin is warm and dry.  Neurological:     Mental Status: She is alert and oriented to person, place, and time.  Psychiatric:        Mood and Affect: Mood normal.        Behavior: Behavior normal.        Thought Content: Thought content normal.        Judgment: Judgment normal.     BP 114/88 (BP Location: Left Arm, Patient Position: Sitting, Cuff Size: Normal)   Pulse (!) 101   Temp 98.5 F (36.9 C) (Oral)   Resp 18   Ht 5' 5 (1.651 m)   Wt 193 lb (87.5 kg)   SpO2 96%   BMI 32.12 kg/m  Wt Readings from Last 3 Encounters:  03/07/24 193 lb (87.5 kg)  02/12/24 190 lb 12.8 oz (86.5 kg)  12/11/23 191 lb (86.6 kg)    Diabetic Foot Exam - Simple   No data filed    Lab Results  Component Value Date   WBC 6.4 12/11/2023   HGB 12.3 12/11/2023   HCT 37.1 12/11/2023   PLT 175.0 12/11/2023   GLUCOSE 75 12/11/2023   CHOL 193 12/11/2023   TRIG 34.0 12/11/2023   HDL 52.20 12/11/2023   LDLCALC 134 (H) 12/11/2023   ALT 12 12/11/2023   AST 12 12/11/2023   NA 138 12/11/2023   K 4.4 12/11/2023   CL 100 12/11/2023   CREATININE 0.79 12/11/2023   BUN 9 12/11/2023   CO2 29 12/11/2023   TSH 1.21 12/11/2023   HGBA1C 5.8 12/11/2023    Lab Results  Component Value Date   TSH 1.21 12/11/2023   Lab Results  Component Value Date   WBC 6.4 12/11/2023   HGB 12.3 12/11/2023   HCT 37.1 12/11/2023   MCV 90.9 12/11/2023   PLT 175.0 12/11/2023   Lab Results  Component Value Date   NA 138 12/11/2023   K 4.4 12/11/2023   CO2 29 12/11/2023   GLUCOSE 75 12/11/2023   BUN 9 12/11/2023   CREATININE 0.79 12/11/2023   BILITOT 0.3 12/11/2023   ALKPHOS 36 (L) 12/11/2023   AST 12 12/11/2023  ALT 12 12/11/2023   PROT 7.1 12/11/2023   ALBUMIN 3.9 12/11/2023    CALCIUM 9.4 12/11/2023   ANIONGAP 9 08/26/2023   GFR 90.41 12/11/2023   Lab Results  Component Value Date   CHOL 193 12/11/2023   Lab Results  Component Value Date   HDL 52.20 12/11/2023   Lab Results  Component Value Date   LDLCALC 134 (H) 12/11/2023   Lab Results  Component Value Date   TRIG 34.0 12/11/2023   Lab Results  Component Value Date   CHOLHDL 4 12/11/2023   Lab Results  Component Value Date   HGBA1C 5.8 12/11/2023       Assessment & Plan:  Insulin  resistance -     Tirzepatide -Weight Management; Inject 5 mg into the skin once a week.  Dispense: 6 mL; Refill: 0 -     Comprehensive metabolic panel with GFR -     CBC with Differential/Platelet -     VITAMIN D  25 Hydroxy (Vit-D Deficiency, Fractures) -     Vitamin B12 -     Lipid panel -     Insulin , random  Seizure disorder (HCC) -     Tirzepatide -Weight Management; Inject 5 mg into the skin once a week.  Dispense: 6 mL; Refill: 0  Obesity (BMI 30.0-34.9) -     Tirzepatide -Weight Management; Inject 5 mg into the skin once a week.  Dispense: 6 mL; Refill: 0 -     Comprehensive metabolic panel with GFR -     CBC with Differential/Platelet -     VITAMIN D  25 Hydroxy (Vit-D Deficiency, Fractures) -     Vitamin B12 -     Lipid panel -     Insulin , random  Assessment and Plan Assessment & Plan Obesity, class 1   She is on a 5 mg medication, which was unavailable at the pharmacy. She consumes adequate protein, approximately 80 to 100 grams daily, including chicken, fish, and some red meat. She takes weekly vitamin D  supplements and is satisfied with them. Sent prescription for 5 mg medication to [facility] pharmacy. Continue current protein intake of 80 to 100 grams daily. Continue weekly vitamin D  supplementation.  Seizure disorder   Well-controlled with Keppra . She has not experienced a seizure in ten years and recently had a neurology appointment. Phentermine was previously associated with a seizure episode,  but she is not currently taking it. Continue Keppra  as prescribed. Continue follow-up with neurologist as scheduled.    Markcus Lazenby R Lowne Chase, DO     [1] No Known Allergies

## 2024-03-08 ENCOUNTER — Ambulatory Visit: Payer: Self-pay | Admitting: Family Medicine

## 2024-03-08 LAB — CBC WITH DIFFERENTIAL/PLATELET
Basophils Absolute: 0 K/uL (ref 0.0–0.1)
Basophils Relative: 0.6 % (ref 0.0–3.0)
Eosinophils Absolute: 0.2 K/uL (ref 0.0–0.7)
Eosinophils Relative: 2.4 % (ref 0.0–5.0)
HCT: 36.1 % (ref 36.0–46.0)
Hemoglobin: 12 g/dL (ref 12.0–15.0)
Lymphocytes Relative: 19.7 % (ref 12.0–46.0)
Lymphs Abs: 1.6 K/uL (ref 0.7–4.0)
MCHC: 33.4 g/dL (ref 30.0–36.0)
MCV: 91.8 fl (ref 78.0–100.0)
Monocytes Absolute: 0.8 K/uL (ref 0.1–1.0)
Monocytes Relative: 9.6 % (ref 3.0–12.0)
Neutro Abs: 5.4 K/uL (ref 1.4–7.7)
Neutrophils Relative %: 67.7 % (ref 43.0–77.0)
Platelets: 171 K/uL (ref 150.0–400.0)
RBC: 3.93 Mil/uL (ref 3.87–5.11)
RDW: 15.9 % — ABNORMAL HIGH (ref 11.5–15.5)
WBC: 8 K/uL (ref 4.0–10.5)

## 2024-03-08 LAB — VITAMIN D 25 HYDROXY (VIT D DEFICIENCY, FRACTURES): VITD: 34.49 ng/mL (ref 30.00–100.00)

## 2024-03-08 LAB — LIPID PANEL
Cholesterol: 186 mg/dL (ref 0–200)
HDL: 52.2 mg/dL (ref >39.00)
LDL Cholesterol: 124 mg/dL — ABNORMAL HIGH (ref 0–99)
NonHDL: 133.57
Total CHOL/HDL Ratio: 4
Triglycerides: 48 mg/dL (ref 0.0–149.0)
VLDL: 9.6 mg/dL (ref 0.0–40.0)

## 2024-03-08 LAB — COMPREHENSIVE METABOLIC PANEL WITH GFR
ALT: 12 U/L (ref 0–35)
AST: 11 U/L (ref 0–37)
Albumin: 3.9 g/dL (ref 3.5–5.2)
Alkaline Phosphatase: 39 U/L (ref 39–117)
BUN: 8 mg/dL (ref 6–23)
CO2: 28 meq/L (ref 19–32)
Calcium: 9.1 mg/dL (ref 8.4–10.5)
Chloride: 103 meq/L (ref 96–112)
Creatinine, Ser: 0.78 mg/dL (ref 0.40–1.20)
GFR: 91.65 mL/min (ref >60.00)
Glucose, Bld: 86 mg/dL (ref 70–99)
Potassium: 4.4 meq/L (ref 3.5–5.1)
Sodium: 138 meq/L (ref 135–145)
Total Bilirubin: 0.3 mg/dL (ref 0.2–1.2)
Total Protein: 7.1 g/dL (ref 6.0–8.3)

## 2024-03-08 LAB — INSULIN, RANDOM: Insulin: 9.2 u[IU]/mL

## 2024-03-08 LAB — VITAMIN B12: Vitamin B-12: 154 pg/mL — ABNORMAL LOW (ref 211–911)

## 2024-05-29 ENCOUNTER — Ambulatory Visit: Admitting: Neurology

## 2024-06-06 ENCOUNTER — Ambulatory Visit: Admitting: Family Medicine
